# Patient Record
Sex: Female | Born: 1990 | Race: White | Hispanic: No | Marital: Single | Smoking: Never smoker
Health system: Southern US, Community
[De-identification: ages and names within clinical notes are randomized; demographics above are authoritative.]

## PROBLEM LIST (undated history)

## (undated) ENCOUNTER — Ambulatory Visit (HOSPITAL_COMMUNITY): Payer: PRIVATE HEALTH INSURANCE | Source: Home / Self Care

## (undated) DIAGNOSIS — D649 Anemia, unspecified: Secondary | ICD-10-CM

## (undated) DIAGNOSIS — E282 Polycystic ovarian syndrome: Secondary | ICD-10-CM

## (undated) DIAGNOSIS — F329 Major depressive disorder, single episode, unspecified: Secondary | ICD-10-CM

## (undated) DIAGNOSIS — E079 Disorder of thyroid, unspecified: Secondary | ICD-10-CM

## (undated) DIAGNOSIS — F909 Attention-deficit hyperactivity disorder, unspecified type: Secondary | ICD-10-CM

## (undated) DIAGNOSIS — F419 Anxiety disorder, unspecified: Secondary | ICD-10-CM

## (undated) DIAGNOSIS — A6920 Lyme disease, unspecified: Secondary | ICD-10-CM

## (undated) DIAGNOSIS — F32A Depression, unspecified: Secondary | ICD-10-CM

## (undated) DIAGNOSIS — D801 Nonfamilial hypogammaglobulinemia: Principal | ICD-10-CM

## (undated) HISTORY — DX: Depression, unspecified: F32.A

## (undated) HISTORY — DX: Lyme disease, unspecified: A69.20

## (undated) HISTORY — PX: WISDOM TOOTH EXTRACTION: SHX21

## (undated) HISTORY — DX: Anemia, unspecified: D64.9

## (undated) HISTORY — DX: Major depressive disorder, single episode, unspecified: F32.9

## (undated) HISTORY — DX: Nonfamilial hypogammaglobulinemia: D80.1

## (undated) HISTORY — DX: Polycystic ovarian syndrome: E28.2

## (undated) HISTORY — DX: Anxiety disorder, unspecified: F41.9

---
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
## 2012-08-15 ENCOUNTER — Ambulatory Visit
Admission: RE | Admit: 2012-08-15 | Discharge: 2012-08-15 | Disposition: A | Discharge status: Home / Self Care | Payer: BC Managed Care – PPO | Source: Ambulatory Visit | Attending: Allergy and Immunology | Admitting: Allergy and Immunology

## 2012-08-15 ENCOUNTER — Ambulatory Visit
Admission: RE | Admit: 2012-08-15 | Discharge: 2012-08-15 | Disposition: A | Discharge status: Home / Self Care | Payer: BC Managed Care – PPO | Source: Ambulatory Visit | Attending: Nurse Practitioner | Admitting: Nurse Practitioner

## 2012-08-15 ENCOUNTER — Other Ambulatory Visit: Payer: Self-pay | Admitting: Allergy and Immunology

## 2012-08-15 ENCOUNTER — Ambulatory Visit: Admission: RE | Admit: 2012-08-15 | Payer: BC Managed Care – PPO | Source: Ambulatory Visit

## 2012-08-15 DIAGNOSIS — J45990 Exercise induced bronchospasm: Secondary | ICD-10-CM

## 2012-08-15 DIAGNOSIS — N926 Irregular menstruation, unspecified: Secondary | ICD-10-CM

## 2013-11-08 IMAGING — US US PELVIS COMPLETE
1 series · 14 of 25 positions shown · non-contrast
Comparison: None.

CLINICAL DATA: 21-year-old with irregular vaginal bleeding.

TRANSABDOMINAL ULTRASOUND OF PELVIS
TECHNIQUE: Transabdominal ultrasound examination of the pelvis was
performed including evaluation of the uterus, ovaries, adnexal
regions, and pelvic cul-de-sac. No transvaginal study was
performed.

[Series 1: us pelvis complete · 0.27mm/px · 14 of 35 slices shown]
[im 1/35]
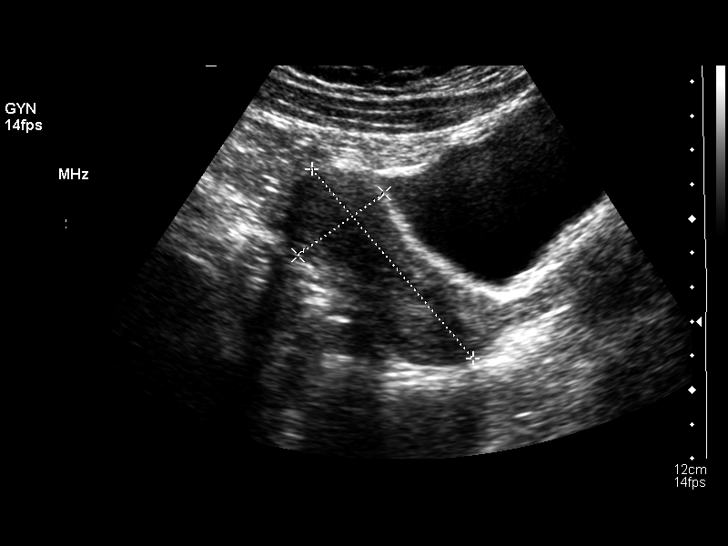
[im 3/35]
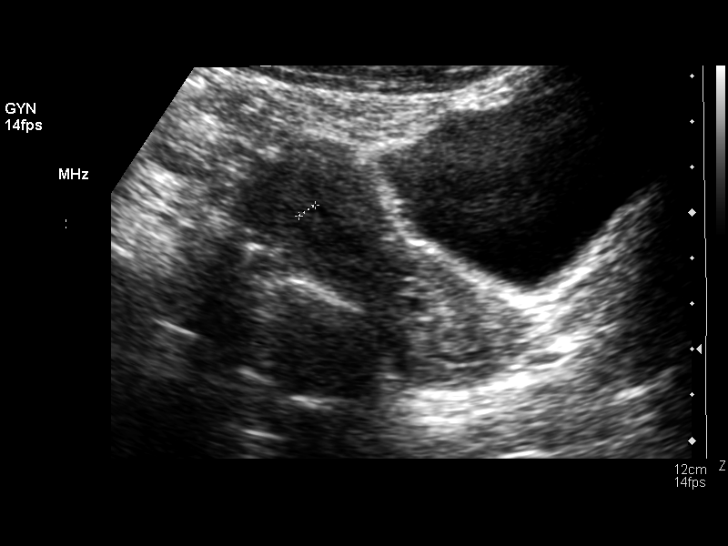
[im 6/35]
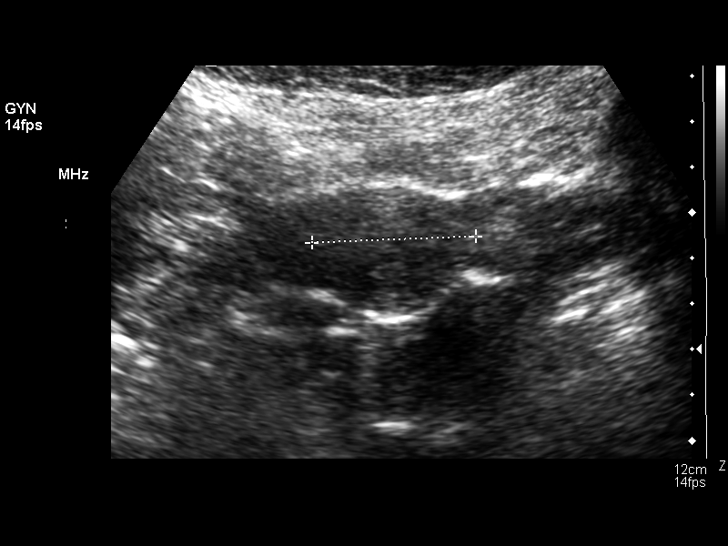
[im 9/35]
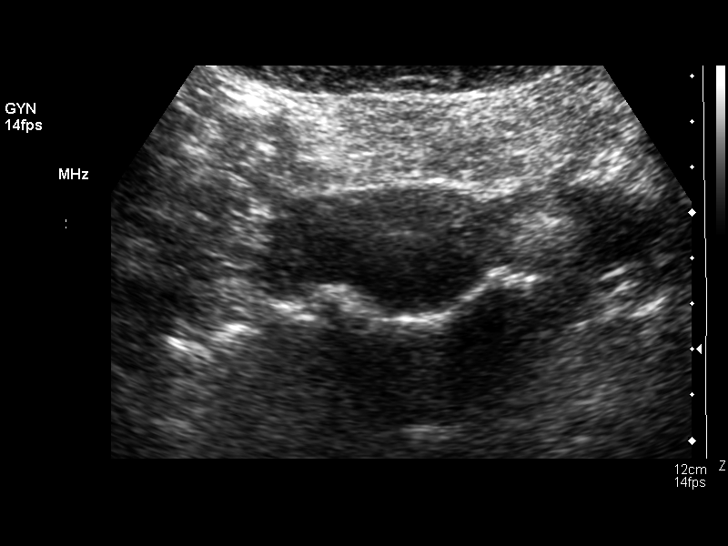
[im 12/35]
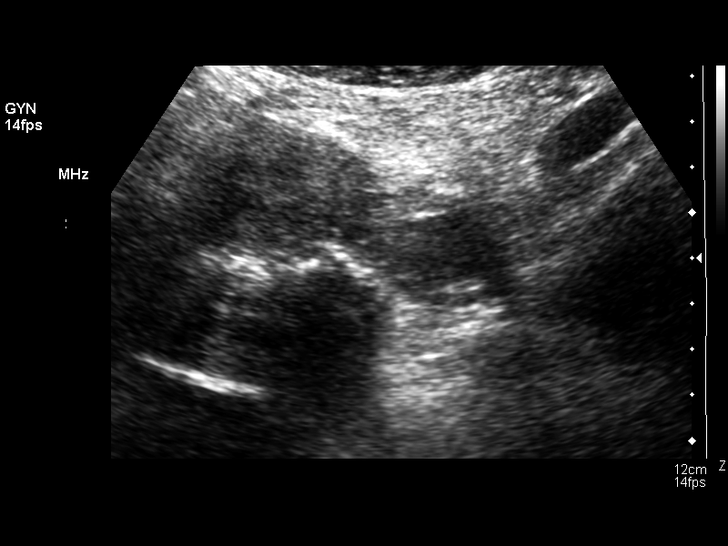
[im 13/35]
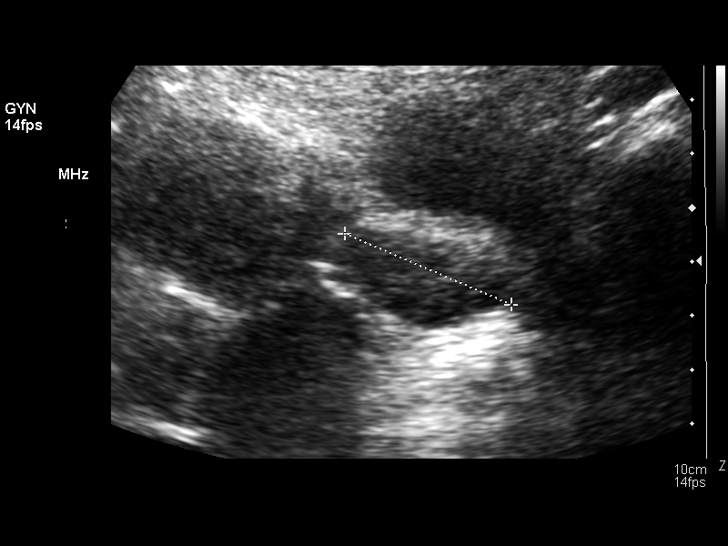
[im 16/35]
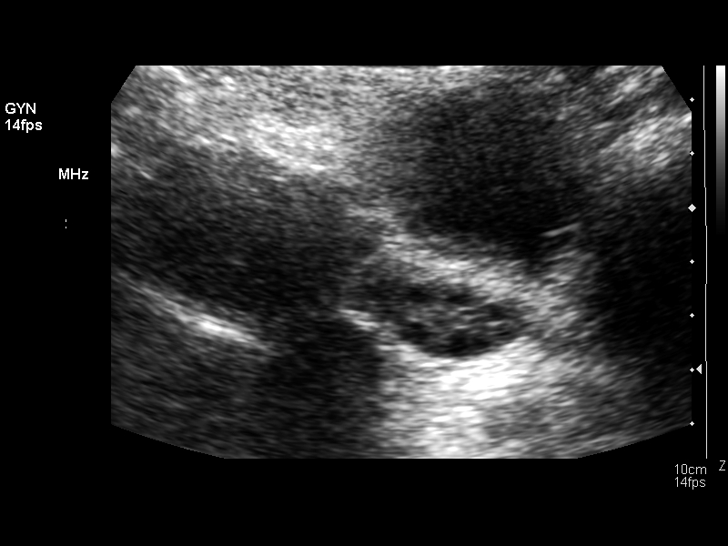
[im 19/35]
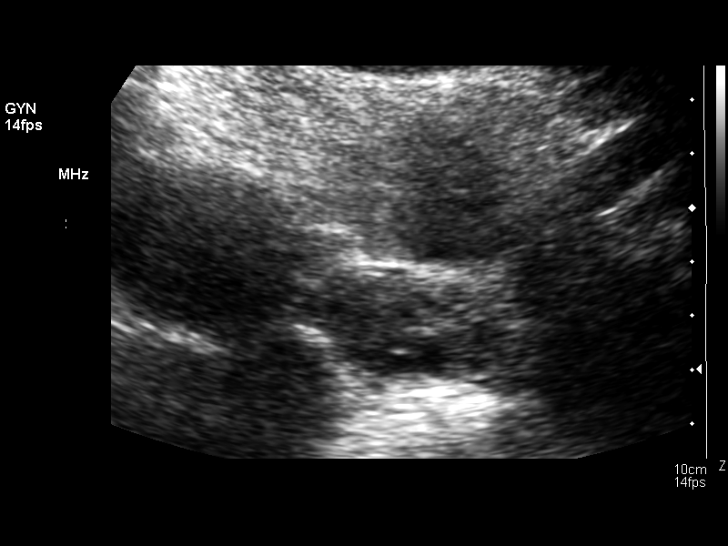
[im 22/35]
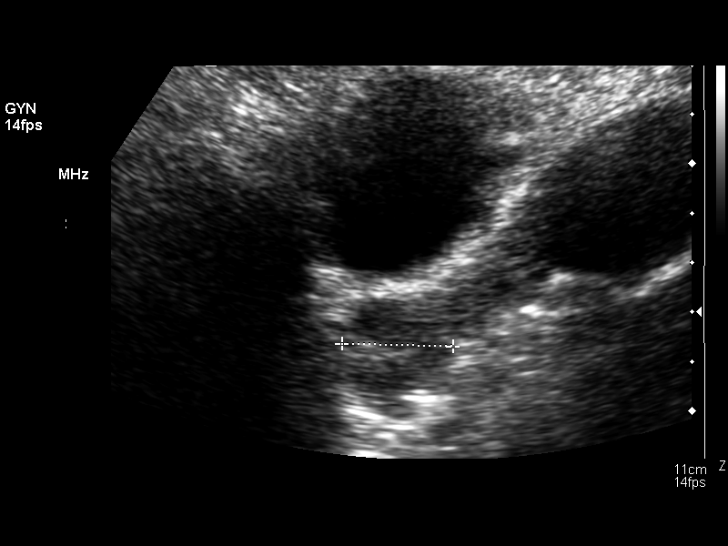
[im 23/35]
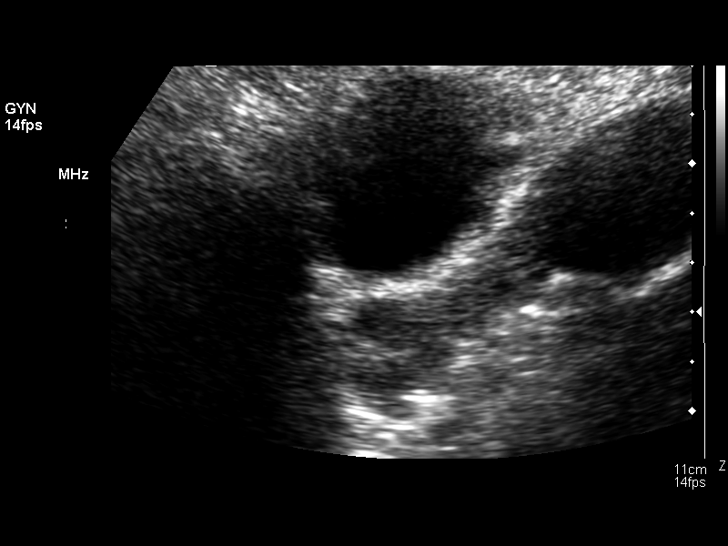
[im 26/35]
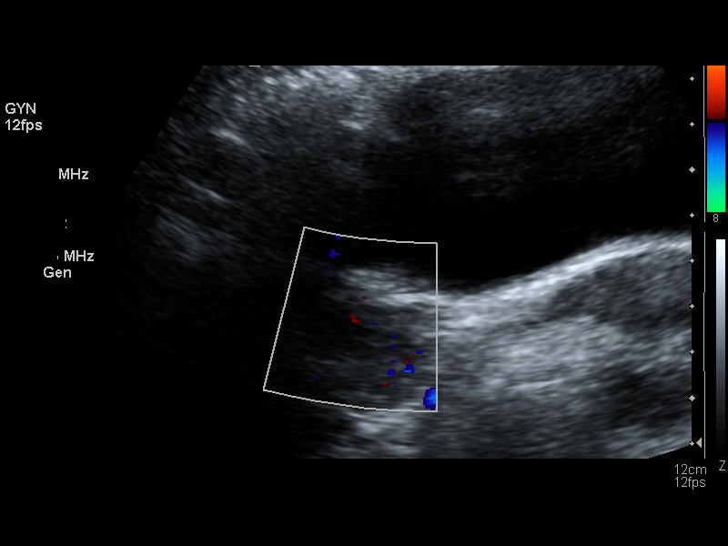
[im 29/35]
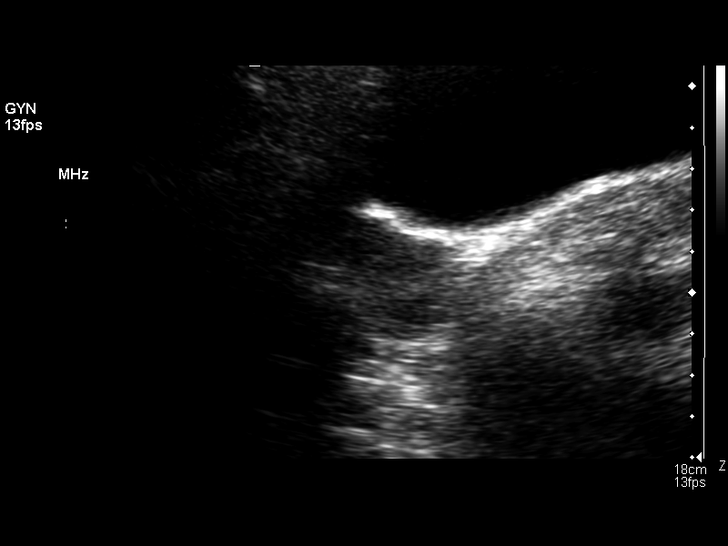
[im 32/35]
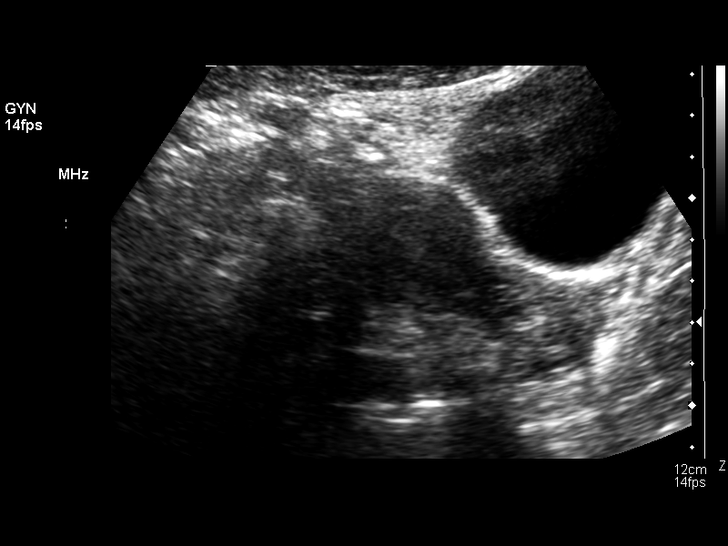
[im 35/35]
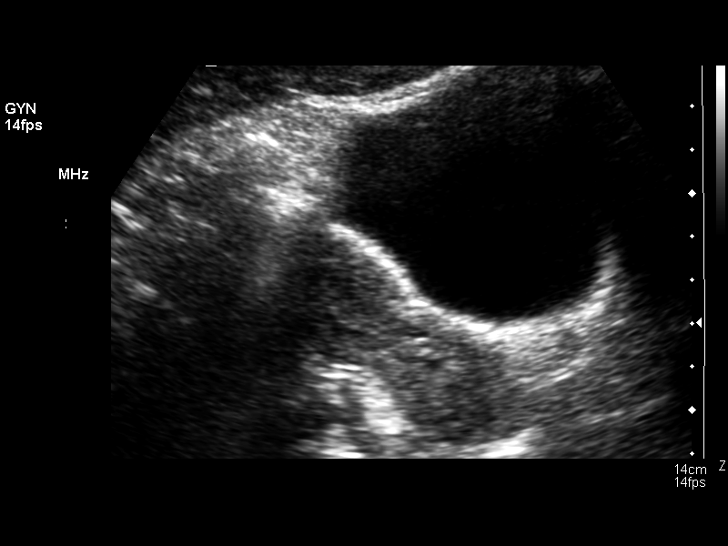

[14 of 25 positions shown; findings below may reference images not displayed]

FINDINGS: Uterus:  Normal in size and appearance, measuring 7.3 x 3.1 x
cm.  The myometrium appears homogeneous.

Endometrium: 4.3 mm in thickness.

Right ovary: Normal appearance, measuring 3.7 x 2.1 x 2.2 cm.  No
adnexal mass.

Left ovary: Normal appearance, measuring 3.4 x 2.3 x 3.0 cm.  No
adnexal mass.

Other Findings:  No free fluid
IMPRESSION: Normal transabdominal pelvic ultrasound.

## 2014-08-30 ENCOUNTER — Telehealth: Payer: Self-pay | Admitting: Hematology and Oncology

## 2014-08-30 NOTE — Telephone Encounter (Signed)
LEFT MESSAGE FOR PATIENT TO RETURN CALL TO SCHEDULE NP APPT.  °

## 2014-08-31 ENCOUNTER — Telehealth: Payer: Self-pay | Admitting: Hematology and Oncology

## 2014-08-31 NOTE — Telephone Encounter (Signed)
S/W PATIENT MOTHER AND GAVE NP APPT FOR 01/21 @ 10 W/DR. GORSUCH

## 2014-09-05 ENCOUNTER — Encounter: Payer: Self-pay | Admitting: Hematology and Oncology

## 2014-09-05 ENCOUNTER — Other Ambulatory Visit: Payer: Self-pay | Admitting: Hematology and Oncology

## 2014-09-05 DIAGNOSIS — D801 Nonfamilial hypogammaglobulinemia: Secondary | ICD-10-CM

## 2014-09-05 HISTORY — DX: Nonfamilial hypogammaglobulinemia: D80.1

## 2014-09-06 ENCOUNTER — Encounter: Payer: Self-pay | Admitting: Hematology and Oncology

## 2014-09-06 ENCOUNTER — Other Ambulatory Visit (HOSPITAL_BASED_OUTPATIENT_CLINIC_OR_DEPARTMENT_OTHER): Payer: BLUE CROSS/BLUE SHIELD

## 2014-09-06 ENCOUNTER — Ambulatory Visit (HOSPITAL_BASED_OUTPATIENT_CLINIC_OR_DEPARTMENT_OTHER): Payer: BLUE CROSS/BLUE SHIELD | Admitting: Hematology and Oncology

## 2014-09-06 ENCOUNTER — Encounter (INDEPENDENT_AMBULATORY_CARE_PROVIDER_SITE_OTHER): Payer: Self-pay

## 2014-09-06 ENCOUNTER — Ambulatory Visit: Payer: BLUE CROSS/BLUE SHIELD

## 2014-09-06 VITALS — BP 121/60 | HR 83 | Temp 98.5°F | Resp 18 | Ht 67.0 in | Wt 150.0 lb

## 2014-09-06 DIAGNOSIS — Z23 Encounter for immunization: Secondary | ICD-10-CM

## 2014-09-06 DIAGNOSIS — E611 Iron deficiency: Secondary | ICD-10-CM

## 2014-09-06 DIAGNOSIS — D801 Nonfamilial hypogammaglobulinemia: Secondary | ICD-10-CM

## 2014-09-06 DIAGNOSIS — Z862 Personal history of diseases of the blood and blood-forming organs and certain disorders involving the immune mechanism: Secondary | ICD-10-CM | POA: Insufficient documentation

## 2014-09-06 LAB — CBC WITH DIFFERENTIAL/PLATELET
BASO%: 0.5 % (ref 0.0–2.0)
BASOS ABS: 0 10*3/uL (ref 0.0–0.1)
EOS ABS: 0.1 10*3/uL (ref 0.0–0.5)
EOS%: 1.8 % (ref 0.0–7.0)
HCT: 39.9 % (ref 34.8–46.6)
HGB: 13 g/dL (ref 11.6–15.9)
LYMPH#: 2.5 10*3/uL (ref 0.9–3.3)
LYMPH%: 40.1 % (ref 14.0–49.7)
MCH: 30.2 pg (ref 25.1–34.0)
MCHC: 32.6 g/dL (ref 31.5–36.0)
MCV: 92.8 fL (ref 79.5–101.0)
MONO#: 0.4 10*3/uL (ref 0.1–0.9)
MONO%: 7 % (ref 0.0–14.0)
NEUT#: 3.1 10*3/uL (ref 1.5–6.5)
NEUT%: 50.6 % (ref 38.4–76.8)
Platelets: 305 10*3/uL (ref 145–400)
RBC: 4.3 10*6/uL (ref 3.70–5.45)
RDW: 11.9 % (ref 11.2–14.5)
WBC: 6.1 10*3/uL (ref 3.9–10.3)

## 2014-09-06 LAB — COMPREHENSIVE METABOLIC PANEL (CC13)
ALBUMIN: 4.6 g/dL (ref 3.5–5.0)
ALT: 13 U/L (ref 0–55)
AST: 18 U/L (ref 5–34)
Alkaline Phosphatase: 67 U/L (ref 40–150)
Anion Gap: 11 mEq/L (ref 3–11)
BUN: 12.1 mg/dL (ref 7.0–26.0)
CHLORIDE: 102 meq/L (ref 98–109)
CO2: 28 meq/L (ref 22–29)
Calcium: 9.7 mg/dL (ref 8.4–10.4)
Creatinine: 0.8 mg/dL (ref 0.6–1.1)
EGFR: >90 mL/min/{1.73_m2} (ref >90)
Glucose: 83 mg/dl (ref 70–140)
Potassium: 3.8 mEq/L (ref 3.5–5.1)
Sodium: 141 mEq/L (ref 136–145)
Total Bilirubin: 0.52 mg/dL (ref 0.20–1.20)
Total Protein: 7.7 g/dL (ref 6.4–8.3)

## 2014-09-06 LAB — LACTATE DEHYDROGENASE (CC13): LDH: 148 U/L (ref 125–245)

## 2014-09-06 MED ORDER — PNEUMOCOCCAL 13-VAL CONJ VACC IM SUSP
0.5000 mL | Freq: Once | INTRAMUSCULAR | Status: AC
  Administered 2014-09-06: 0.5 mL via INTRAMUSCULAR
  Filled 2014-09-06: qty 0.5

## 2014-09-06 NOTE — Assessment & Plan Note (Signed)
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 She was told to come here for possible immunoglobulin infusion. However, her last blood work in March 2015 did not suggest hypogammaglobulinemia. Recommend repeat check immunoglobulin levels and I discussed with her and her mother extensively that there will be a role in IVIG only if we have documented low immunoglobulin levels. Given her history of recurrent bacterial infection, I recommend infectious disease consultation. Her case is not typical for myeloperoxidase deficiency as the type of infections she had was more related to respiratory symptoms. I recommend Prevnar injection today. I will call her mother with test results.

## 2014-09-06 NOTE — Assessment & Plan Note (Signed)
She had history of iron deficiency anemia but currently she is not anemic. She does have mild iron deficiency. I recommend iron supplement 3 times a week for the next 6 months and recheck ferritin level with PCP.

## 2014-09-06 NOTE — Progress Notes (Signed)
[facility] CONSULT NOTE  Patient Care Team: Rita OharaJames Skeen, MD as PCP - General (Pain Medicine)  CHIEF COMPLAINTS/PURPOSE OF CONSULTATION:  History of recurrent infection, possible hypogammaglobulinemia  HISTORY OF PRESENTING ILLNESS:  Erika Merinomily Benway 24 y.o. female is here because of history of recurrent infection. Her mother is able to provide most of the history. I reviewed over 20 pages of outside records and collaborated the history with the patient. At birth, the patient requires severe infection as a newborn.  Within 2 months of birth, she developed streptococcal infection requiring antibiotics. At [age] year old, she had severe ear infection and tonsillitis. At grade school, she had recurrent influenza infection several times. As a teenager, she developed severe, recurrent upper respiratory and ear infections. 5 years ago, she was found to have Lyme disease that was documented with Weston blot method. In college, she was exposed to various infections and recalled being treated with 8 courses of Z-Pak. She was also found to have mycoplasma pneumonia and Chlamydia infection and received intravenous antibiotics. In 2015, she was treated with IV antibiotics and antifungal treatment. She was also evaluated by ENT in the past for recurrent upper respiratory tract infection. In addition, the patient had complained of chronic fatigue. She eventually history of heavy menorrhagia in the past and has severe iron deficiency anemia requiring IV iron. She never received blood transfusion. She was subsequently diagnosed with hypothyroidism and polycystic ovarian syndrome and is currently being treated for that.  MEDICAL HISTORY:  Past Medical History  Diagnosis Date  . PCOS (polycystic ovarian syndrome)   . Thyroid disease   . ADHD (attention deficit hyperactivity disorder)   . 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 No pertinent past surgical  history.  SOCIAL HISTORY: History   Social History  . Marital Status: Single    Spouse Name: N/A    Number of Children: N/A  . Years of Education: N/A   Occupational History  . Not on file.   Social History Main Topics  . Smoking status: Never Smoker   . Smokeless tobacco: Never Used  . Alcohol Use: No  . Drug Use: No  . Sexual Activity: No   Other Topics Concern  . Not on file   Social History Narrative    FAMILY HISTORY: History reviewed. No pertinent family history.  ALLERGIES:  has No Known Allergies.  MEDICATIONS:  Current Outpatient Prescriptions  Medication Sig Dispense Refill  . azithromycin (ZITHROMAX) 250 MG tablet Take by mouth daily.    . Cholecalciferol (VITAMIN D PO) Take 25,000 Units by mouth 3 (three) times a week.    Marland Kitchen. LIOTHYRONINE SODIUM PO Take by mouth.    . lisdexamfetamine (VYVANSE) 50 MG capsule Take 50 mg by mouth daily.    . metFORMIN (GLUMETZA) 1000 MG (MOD) 24 hr tablet Take 1,000 mg by mouth 2 (two) times daily with a meal.    . montelukast (SINGULAIR) 10 MG tablet Take 10 mg by mouth at bedtime.    Marland Kitchen. spironolactone (ALDACTONE) 100 MG tablet Take 200 mg by mouth daily. Pt takes 150mg  daily     No current facility-administered medications for this visit.    REVIEW OF SYSTEMS:   Constitutional: Denies fevers, chills or abnormal night sweats Eyes: Denies blurriness of vision, double vision or watery eyes Ears, nose, mouth, throat, and face: Denies mucositis or sore throat Respiratory: Denies cough, dyspnea or wheezes Cardiovascular: Denies palpitation, chest discomfort or lower extremity swelling Gastrointestinal:  Denies nausea,  heartburn or change in bowel habits Skin: Denies abnormal skin rashes Lymphatics: Denies new lymphadenopathy or easy bruising Neurological:Denies numbness, tingling or new weaknesses Behavioral/Psych: Mood is stable, no new changes  All other systems were reviewed with the patient and are negative.  PHYSICAL  EXAMINATION: ECOG PERFORMANCE STATUS: 0 - Asymptomatic  Filed Vitals:   09/06/14 1043  BP: 121/60  Pulse: 83  Temp: 98.5 F (36.9 C)  Resp: 18   Filed Weights   09/06/14 1043  Weight: 150 lb (68.04 kg)    GENERAL:alert, no distress and comfortable SKIN: skin color, texture, turgor are normal, no rashes or significant lesions EYES: normal, conjunctiva are pink and non-injected, sclera clear OROPHARYNX:no exudate, no erythema and lips, buccal mucosa, and tongue normal  NECK: supple, thyroid normal size, non-tender, without nodularity LYMPH:  no palpable lymphadenopathy in the cervical, axillary or inguinal LUNGS: clear to auscultation and percussion with normal breathing effort HEART: regular rate & rhythm and no murmurs and no lower extremity edema ABDOMEN:abdomen soft, non-tender and normal bowel sounds Musculoskeletal:no cyanosis of digits and no clubbing  PSYCH: alert & oriented x 3 with fluent speech NEURO: no focal motor/sensory deficits  LABORATORY DATA:  I have reviewed the data as listed Lab Results  Component Value Date   WBC 6.1 09/06/2014   HGB 13.0 09/06/2014   HCT 39.9 09/06/2014   MCV 92.8 09/06/2014   PLT 305 09/06/2014    Recent Labs  09/06/14 1229  NA 141  K 3.8  CO2 28  GLUCOSE 83  BUN 12.1  CREATININE 0.8  CALCIUM 9.7  PROT 7.7  ALBUMIN 4.6  AST 18  ALT 13  ALKPHOS 67  BILITOT 0.52  ASSESSMENT & PLAN:  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 She was told to come here for possible immunoglobulin infusion. However, her last blood work in March 2015 did not suggest hypogammaglobulinemia. Recommend repeat check immunoglobulin levels and I discussed with her and her mother extensively that there will be a role in IVIG only if we have documented low immunoglobulin levels. Given her history of recurrent bacterial infection, I recommend infectious disease consultation. Her case  is not typical for myeloperoxidase deficiency as the type of infections she had was more related to respiratory symptoms. I recommend Prevnar injection today. I will call her mother with test results.   History of iron deficiency anemia She had history of iron deficiency anemia but currently she is not anemic. She does have mild iron deficiency. I recommend iron supplement 3 times a week for the next 6 months and recheck ferritin level with PCP.      All questions were answered. The patient knows to call the clinic with any problems, questions or concerns. I spent 40 minutes counseling the patient face to face. The total time spent in the appointment was 60 minutes and more than 50% was on counseling.     [facility], Beverlyann Broxterman, MD 09/06/2014 3:34 PM

## 2014-09-06 NOTE — Progress Notes (Signed)
Checked in new pt with no financial concerns prior to seeing the dr.  Pt is here for a hematology concern so financial assistance may not be needed but she has Raquel's card for any billing or insurance questions or concerns. ° °

## 2014-09-07 ENCOUNTER — Telehealth: Payer: Self-pay | Admitting: Hematology and Oncology

## 2014-09-07 LAB — SEDIMENTATION RATE: Sed Rate: 4 mm/hr (ref 0–22)

## 2014-09-07 LAB — IGG, IGA, IGM
IGA: 138 mg/dL (ref 69–380)
IGG (IMMUNOGLOBIN G), SERUM: 976 mg/dL (ref 690–1700)
IGM, SERUM: 167 mg/dL (ref 52–322)

## 2014-09-07 NOTE — Telephone Encounter (Signed)
Pt was seen today, per 01/21 POF pt does not have to return but has a referral for Dr. Paulette Blanchornelius [place]. I called and office is closed will call back Monday when they reopen.

## 2014-09-10 ENCOUNTER — Telehealth: Payer: Self-pay | Admitting: Hematology and Oncology

## 2014-09-10 NOTE — Telephone Encounter (Signed)
I reviewed test results with the patient's mother. There is no indication for IVIG as discussed from previous visit.

## 2014-09-11 ENCOUNTER — Telehealth: Payer: Self-pay | Admitting: Hematology and Oncology

## 2014-09-11 NOTE — Telephone Encounter (Signed)
S/w Tammy at Infectious Disease advised they would pull from the workqueue and contact pt and our office advising of D/T for consultation.... KJ

## 2014-09-25 ENCOUNTER — Encounter: Payer: Self-pay | Admitting: Internal Medicine

## 2014-09-25 ENCOUNTER — Ambulatory Visit (INDEPENDENT_AMBULATORY_CARE_PROVIDER_SITE_OTHER): Payer: BLUE CROSS/BLUE SHIELD | Admitting: Internal Medicine

## 2014-09-25 VITALS — BP 114/78 | Temp 97.5°F | Ht 67.0 in | Wt 149.0 lb

## 2014-09-25 DIAGNOSIS — R5382 Chronic fatigue, unspecified: Secondary | ICD-10-CM | POA: Diagnosis not present

## 2014-09-25 NOTE — Progress Notes (Signed)
Patient ID: Erika Orr, female   DOB: 28-Aug-1990, 24 y.o.   MRN: [id number]         [facility]  Reason for Consult: History of recurrent infections Referring Physician: Dr. Artis DelayNi Gorsuch  Patient Active Problem List   Diagnosis Date Noted  . Chronic fatigue 09/25/2014    Priority: High  . History of iron deficiency anemia 09/06/2014  . Hypothyroidism 01/28/2011  . PCOS (polycystic ovarian syndrome) 01/28/2011    Patient's Medications  New Prescriptions   No medications on file  Previous Medications   AZITHROMYCIN (ZITHROMAX) 250 MG TABLET    Take by mouth daily.   CHOLECALCIFEROL (VITAMIN D PO)    Take 25,000 Units by mouth 3 (three) times a week.   LIOTHYRONINE SODIUM PO    Take by mouth.   LISDEXAMFETAMINE (VYVANSE) 50 MG CAPSULE    Take 50 mg by mouth daily.   METFORMIN (GLUMETZA) 1000 MG (MOD) 24 HR TABLET    Take 1,000 mg by mouth 2 (two) times daily with a meal.   MONTELUKAST (SINGULAIR) 10 MG TABLET    Take 10 mg by mouth at bedtime.   SPIRONOLACTONE (ALDACTONE) 100 MG TABLET    Take 200 mg by mouth daily. Pt takes 150mg  daily  Modified Medications   No medications on file  Discontinued Medications   No medications on file     Recommendations: 1. Discontinue daily azithromycin 2. I have offered to review more comprehensive past medical records. [name]'s mother will make photocopies and drop records by our office for my review. I will call them once I have reviewed the records.   Assessment: I have offered to review her extensive past medical records before making final recommendations. However based on the records I do have and her examination today I do not suspect any significant underlying immunodeficiency. I do not see any indication for chronic antibiotic therapy and would recommend stopping daily azithromycin. I also do not see any indication for starting immunoglobulin infusions. I think that she probably does meet current, new  definitions for chronic fatigue syndrome (systemic exertion intolerance disease).   HPI: Erika Orr is a 24 y.o. female who is referred to me for evaluation of her history of recurrent infections. She is accompanied by her mother today and she frequently defers to her mother to answer my questions. Her mother states that she was considered to be a very healthy newborn but developed a "cold" within several weeks of going home from the hospital. At 582 months of age she was diagnosed with strep throat requiring antibiotics and was told that that was very unusual. When she was a year and a half older she had problems with severe ear infections. She saw an ear nose and throat specialist who told her that her years looked fine but that she needed a tonsillectomy. I chose not to proceed with tonsillectomy and her tonsils were noted to be normal within the next year. Her mother recalls that she was diagnosed with influenza several times during middle and high school. She believes that she had H1 N1 influenza in 2010.  Her mother reiterates on several occasions during the exam that she was a "healthy child". However in recent years she has continued to be bothered with problems that he initially described as recurrent infections. She states that she has been treated with Z-Paks on numerous occasions and is currently taking azithromycin daily on a chronic basis. Her mother indicates that she was diagnosed with  having Lyme disease, Epstein-Barr disease, mycoplasma infection, Chlamydia pneumoniae infection as well as exposure to Aspergillus and black molds. She sees Elie Goody, NP locally at the [facility] and Dr. Hadassah Pais at [facility] in [place]. Her mother has a thick packet of previous lab results with her. He looks like a whole battery of infectious disease serologies have been done indicating high antibody levels to multiple infectious disease as noted  above.  I ask [name] to describe a typical bout of infection that would lead to her being prescribed antibiotics. She stated that this would generally occur when she feels more fatigued than usual. She her mother describe chronic fatigue that has interfered with her ability to do normal activities. She went away to college at [employer] but had to quit school and return her home for a period of time. She then enrolled at [employer] last year but again had to quit her studies and return home. She is now living at home and attending [employer] She states that she has not noted any improvement in her fatigue when she has been treated with antibiotics. She has had normal immunoglobulin levels measured on several occasions in the past several years. They tell me that Dr. Thresa Ross told her one year ago that she did not need supplemental immunoglobulin infusions. However they note that she was found to have a suboptimal response to pneumococcal vaccination last year and the recommendation was changed. She was told that she would need immunoglobulin infusions to prevent serious infection such as pneumonia.  Despite being fatigued and she states that she has not given up many of her usual activities other than school work. She does get some regular exercise. She sleeps well and wakes feeling rested most of the time. She does note that when she takes a full course load in college the effort easily overwhelms her and makes her fatigue greater. This is why she has had to leave school on 2 occasions. She denies ever having any periods of depression or anxiety.    Review of Systems: Constitutional: positive for fatigue, negative for anorexia, chills, fevers, sweats and weight loss Eyes: negative Ears, nose, mouth, throat, and face: negative Respiratory: negative Cardiovascular: negative Gastrointestinal: negative Genitourinary:negative Integument/breast: negative    Past Medical History   Diagnosis Date  . PCOS (polycystic ovarian syndrome)   . Thyroid disease   . ADHD (attention deficit hyperactivity disorder)   . 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 Anemia     History  Substance Use Topics  . Smoking status: Never Smoker   . Smokeless tobacco: Never Used  . Alcohol Use: 0.0 oz/week    0 Not specified per week     Comment: occasional    History reviewed. No pertinent family history. No Known Allergies  OBJECTIVE: Blood pressure 114/78, temperature 97.5 F (36.4 C), temperature source Oral, height  (1.702 m), weight 149 lb (67.586 kg). General: She is a healthy-appearing young woman in no distress Skin: No rash Lymph nodes: No palpable adenopathy Oral: No oropharyngeal lesions. Teeth are in good condition Lungs: Clear Cor: Regular S1 and S2 with no murmurs Abdomen: Soft and nontender with no palpable masses Mood and affect: Normal affect with no signs of depression or anxiety  Microbiology: No results found for this or any previous visit (from the past 240 hour(s)).  Cliffton Asters, MD [facility] [phone] pager   [phone] cell 09/25/2014, 1:35  PM

## 2014-12-06 DIAGNOSIS — J329 Chronic sinusitis, unspecified: Secondary | ICD-10-CM | POA: Insufficient documentation

## 2015-10-24 ENCOUNTER — Encounter: Payer: Self-pay | Admitting: Obstetrics and Gynecology

## 2015-11-01 ENCOUNTER — Telehealth: Payer: Self-pay | Admitting: Hematology and Oncology

## 2015-11-01 NOTE — Telephone Encounter (Signed)
[facility] office Erika Manis(Elizabeth) called to get fax number and confirm that Erika Orr is md and billing number

## 2016-01-06 ENCOUNTER — Encounter: Payer: Self-pay | Admitting: Obstetrics and Gynecology

## 2016-04-16 ENCOUNTER — Ambulatory Visit
Admission: RE | Admit: 2016-04-16 | Discharge: 2016-04-16 | Disposition: A | Discharge status: Home / Self Care | Payer: PRIVATE HEALTH INSURANCE | Source: Ambulatory Visit | Attending: Allergy and Immunology | Admitting: Allergy and Immunology

## 2016-04-16 ENCOUNTER — Other Ambulatory Visit: Payer: Self-pay | Admitting: Allergy and Immunology

## 2016-04-16 DIAGNOSIS — J45990 Exercise induced bronchospasm: Secondary | ICD-10-CM

## 2016-08-27 ENCOUNTER — Encounter: Payer: Self-pay | Admitting: Obstetrics & Gynecology

## 2016-12-17 ENCOUNTER — Encounter: Payer: Self-pay | Admitting: Obstetrics & Gynecology

## 2017-06-04 ENCOUNTER — Encounter: Payer: Self-pay | Admitting: Obstetrics & Gynecology

## 2017-09-02 ENCOUNTER — Encounter: Payer: BLUE CROSS/BLUE SHIELD | Admitting: Obstetrics & Gynecology

## 2017-09-21 ENCOUNTER — Encounter: Payer: Self-pay | Admitting: Obstetrics & Gynecology

## 2017-09-21 ENCOUNTER — Ambulatory Visit (INDEPENDENT_AMBULATORY_CARE_PROVIDER_SITE_OTHER): Payer: BLUE CROSS/BLUE SHIELD | Admitting: Obstetrics & Gynecology

## 2017-09-21 ENCOUNTER — Other Ambulatory Visit: Payer: Self-pay

## 2017-09-21 VITALS — BP 128/74 | HR 104 | Resp 14 | Ht 67.5 in | Wt 139.0 lb

## 2017-09-21 DIAGNOSIS — Z01419 Encounter for gynecological examination (general) (routine) without abnormal findings: Secondary | ICD-10-CM

## 2017-09-21 DIAGNOSIS — Z202 Contact with and (suspected) exposure to infections with a predominantly sexual mode of transmission: Secondary | ICD-10-CM | POA: Diagnosis not present

## 2017-09-21 DIAGNOSIS — Z124 Encounter for screening for malignant neoplasm of cervix: Secondary | ICD-10-CM | POA: Diagnosis not present

## 2017-09-21 NOTE — Progress Notes (Signed)
27 y.o. G0P0000 SingleCaucasianF here for new patient annual exam and to discuss cycles/contraception.  She is accompanied by her mother.  Pt still living at home and mother has been keeping track of cycles.  She has the last two years written down on a piece of paper that she brought today.    Mother reports cycles have been irregular for "years".  In the last year, her cycles were Jan 9-11, Feb 10-13, March 14-17, April 12-17, May 2-8 and 28-31,.  Skipped June.  Cycle was in July 1-4 and 24-26, Aug 2-4 and 21-23, Sep 21-23, Oct 13-17.  Has been on OCPs in the past.  Pt is concerned about weight gain but has been contemplating IUD use.    Has been diagnosed with PCOS.  Is on spironolactone and Metformin.  Was on [place]umetza but insurance won't cover.  Mother is convinced daughter "doesn't act the same".  Pt not clearly in agreement.  No specific side effects with Metformin XL.      Pt and mother have been followed [place] integr[facility] [place].  Has two inch thick folder of lab work.  No notes accompany this.  Most recent lab work copies to be scanned into Epic.  Pt admits to 12 prior sexual partners.  Mother is unaware of this.  Advised need for STD testing. Pt consents today only to GC/Chlamydia testing.  She declines other testing at this time.  Reports she is careful with condom use.  Patient's last menstrual period was 09/09/2017.          Sexually active: Yes.    The current method of family planning is condoms every time.    Exercising: Yes.    pilates, walking, weights Smoker:  no  Health Maintenance: Pap:  never History of abnormal Pap:  n/a MMG:  n/a Colonoscopy:  n/a BMD:   n/a TDaP:  09/08/17  Pneumonia vaccine(s):  09/06/14  Shingrix:   No  Hep C testing: no Screening Labs: [employer] [facility] , Hb today: same, Urine today: not collected    reports that  has never smoked. she has never used smokeless tobacco. She reports that she drinks alcohol. She  reports that she does not use drugs.  Past Medical History:  Diagnosis Date  . ADHD (attention deficit hyperactivity disorder)   . Anemia   . Anxiety   . Depression   . 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 Lyme disease   . PCOS (polycystic ovarian syndrome)   . Polycystic ovarian syndrome   . Thyroid disease     Past Surgical History:  Procedure Laterality Date  . WISDOM TOOTH EXTRACTION      Current Outpatient Medications  Medication Sig Dispense Refill  . amoxicillin (AMOXIL) 500 MG capsule TAKE 1 CAPSULE (500 MG TOTAL) BY MOUTH 3 TIMES DAILY  1  . IRON PO Take by mouth.    . Lactobacillus Rhamnosus, GG, (CULTURELLE PO) Take by mouth.    Marland Kitchen. LIOTHYRONINE SODIUM PO Take by mouth.    . lisdexamfetamine (VYVANSE) 50 MG capsule Take 50 mg by mouth daily.    . metFORMIN (GLUMETZA) 1000 MG (MOD) 24 hr tablet Take 1,000 mg by mouth 2 (two) times daily with a meal.    . montelukast (SINGULAIR) 10 MG tablet Take 10 mg by mouth at bedtime.    . NON FORMULARY Med Name: **  MMB 5/2.5/50 mg 3 x weekly    . NON FORMULARY Dr. Janalyn Rouseawls    .  spironolactone (ALDACTONE) 100 MG tablet Take 200 mg by mouth daily. Pt takes 150mg  daily    . venlafaxine XR (EFFEXOR-XR) 75 MG 24 hr capsule Take by mouth daily.  2  . Vitamin D, Ergocalciferol, (DRISDOL) 50000 units CAPS capsule Take by mouth.    Marland Kitchen azithromycin (ZITHROMAX) 250 MG tablet Take by mouth daily.     No current facility-administered medications for this visit.     Family History  Problem Relation Age of Onset  . Heart disease Paternal Grandmother   . Heart disease Paternal Grandfather     ROS:  Pertinent items are noted in HPI.  Otherwise, a comprehensive ROS was negative.  Exam:   BP 128/74 (BP Location: Right Arm, Patient Position: Sitting, Cuff Size: Normal)   Pulse (!) 104   Resp 14   Ht 5' 7.5" (1.715 m)   Wt 139 lb (63 kg)   LMP 09/09/2017   BMI 21.45 kg/m    Height: 5' 7.5" (171.5 cm)  Ht Readings from Last 3  Encounters:  09/21/17 5' 7.5" (1.715 m)  09/25/14 5\' 7"  (1.702 m)  09/06/14 5\' 7"  (1.702 m)    General appearance: alert, cooperative and appears stated age Head: Normocephalic, without obvious abnormality, atraumatic Neck: no adenopathy, supple, symmetrical, trachea midline and thyroid normal to inspection and palpation Lungs: clear to auscultation bilaterally Breasts: normal appearance, no masses or tenderness Heart: regular rate and rhythm Abdomen: soft, non-tender; bowel sounds normal; no masses,  no organomegaly Extremities: extremities normal, atraumatic, no cyanosis or edema Skin: Skin color, texture, turgor normal. No rashes or lesions Lymph nodes: Cervical, supraclavicular, and axillary nodes normal. No abnormal inguinal nodes palpated Neurologic: Grossly normal   Pelvic: External genitalia:  no lesions              Urethra:  normal appearing urethra with no masses, tenderness or lesions              Bartholins and Skenes: normal                 Vagina: normal appearing vagina with normal color and discharge, no lesions              Cervix: no lesions              Pap taken: Yes.   Bimanual Exam:  Uterus:  normal size, contour, position, consistency, mobility, non-tender              Adnexa: normal adnexa and no mass, fullness, tenderness               Rectovaginal: Confirms               Anus:  normal sphincter tone, no lesions  Chaperone was present for exam.  A:  Well Woman with normal exam PCOS SA with multiple prior partners, no recent STD testing H/O iron deficiency anemia in the past requiring iron transfusions.  Most recent lab work is normal. Hypothyroidism   P:   Mammogram guidelines reviewed Contraception options discussed.  Most interested in IUD.  Information provided.  Will precert.  Pt knows to call with onset of cycle if desires placement.  Could also use POP but she is favoring IUD. pap smear obtained today.  GC/CHL only obtained pt pt's desires.   Feel full STD testing is warranted. Return annually or prn

## 2017-09-24 ENCOUNTER — Other Ambulatory Visit (HOSPITAL_COMMUNITY)
Admission: RE | Admit: 2017-09-24 | Discharge: 2017-09-24 | Disposition: A | Discharge status: Home / Self Care | Payer: BLUE CROSS/BLUE SHIELD | Source: Ambulatory Visit | Attending: Obstetrics & Gynecology | Admitting: Obstetrics & Gynecology

## 2017-09-24 DIAGNOSIS — Z124 Encounter for screening for malignant neoplasm of cervix: Secondary | ICD-10-CM | POA: Diagnosis present

## 2017-09-24 DIAGNOSIS — Z202 Contact with and (suspected) exposure to infections with a predominantly sexual mode of transmission: Secondary | ICD-10-CM | POA: Diagnosis present

## 2017-09-30 LAB — CYTOLOGY - PAP
Chlamydia: NEGATIVE
Diagnosis: UNDETERMINED — AB
HPV (WINDOPATH): DETECTED — AB
Neisseria Gonorrhea: NEGATIVE

## 2017-10-01 ENCOUNTER — Telehealth: Payer: Self-pay | Admitting: *Deleted

## 2017-10-01 NOTE — Telephone Encounter (Signed)
Called cell phone number. Voicemail not set up. Unable to leave message.  Please route to triage if I am not available.   Jerene BearsMiller, Mary S, MD  Gara KronerMorales, Reina C, CMA        Please let pt know her pap showed ASCUS changes with +HR HPV. Needs pap 1 year. 08 recall.   Also, GC/CHL testing was negative.

## 2017-10-05 NOTE — Telephone Encounter (Signed)
Second attempt to contact patient. Unable to leave message. Voicemail not set up.

## 2017-10-07 ENCOUNTER — Encounter: Payer: Self-pay | Admitting: Obstetrics & Gynecology

## 2017-10-07 NOTE — Telephone Encounter (Signed)
Call to patient cell [phone], listed on DPR. Unable to leave message, voice mail not set up.

## 2017-10-08 NOTE — Telephone Encounter (Signed)
Unable to reach letter sent to patient. Per Dr. Hyacinth MeekerMiller

## 2017-10-22 ENCOUNTER — Telehealth: Payer: Self-pay | Admitting: Obstetrics & Gynecology

## 2017-10-22 NOTE — Telephone Encounter (Signed)
Attempted to reach patient at number provided. There was no answer and recording states that the voicemail box has not been set up yet.  Notes recorded by Jerene BearsMiller, Mary S, MD on 10/01/2017 at 2:35 PM EST Please let pt know her pap showed ASCUS changes with +HR HPV. Needs pap 1 year. 08 recall.  Also, GC/CHL testing was negative.

## 2017-10-22 NOTE — Telephone Encounter (Signed)
Patient received a letter to call for lab results. [phone]

## 2017-10-26 NOTE — Telephone Encounter (Signed)
Spoke with patient. Results given. Patient verbalizes understanding. 08 recall placed. Encounter closed.  

## 2017-10-26 NOTE — Telephone Encounter (Signed)
Return call to Kaitlyn. °

## 2018-08-08 ENCOUNTER — Ambulatory Visit (INDEPENDENT_AMBULATORY_CARE_PROVIDER_SITE_OTHER): Payer: BLUE CROSS/BLUE SHIELD | Admitting: Obstetrics & Gynecology

## 2018-08-08 ENCOUNTER — Telehealth: Payer: Self-pay | Admitting: Obstetrics & Gynecology

## 2018-08-08 VITALS — BP 118/64 | HR 106 | Resp 14 | Ht 67.5 in | Wt 150.0 lb

## 2018-08-08 DIAGNOSIS — Z3009 Encounter for other general counseling and advice on contraception: Secondary | ICD-10-CM | POA: Diagnosis not present

## 2018-08-08 DIAGNOSIS — N750 Cyst of Bartholin's gland: Secondary | ICD-10-CM | POA: Diagnosis not present

## 2018-08-08 DIAGNOSIS — B977 Papillomavirus as the cause of diseases classified elsewhere: Secondary | ICD-10-CM

## 2018-08-08 NOTE — Progress Notes (Signed)
GYNECOLOGY  VISIT  CC:  Vulvar swelling   HPI: 27 y.o. G0P0000 Single White or Caucasian female here for vulvar swelling patient noticed with bathing.  Does have a known Bartholin's cyst that has been present.  Pt kind of forgot this was noted with prior exam.  Denies pain or drainage.  Has no vaginal discharge.  Pt is SA.  She has been tested for GC/Chl in the past year.    Most recent pap showed +HR HPV.  Mother accompanies her today and has many questions about this including the Gardisil vaccination and usefulness once HPV is present.  Questions answered.  Feel Gardisil is still beneficial and encouraged to consider.  New extended use of this by FDA discussed.  Series reviewed.  Questions regarding transmission, causes, follow up, clearance rates, possible results of having HR HPV all discussed.  Lastly, contraception options want to be discussed today.  Pt's mother would like her to consider IUD.  Types, placement, success rates, side effects, risks reviewed.  All questions answered.    GYNECOLOGIC HISTORY: No LMP recorded. Contraception: none Menopausal hormone therapy:none  Patient Active Problem List   Diagnosis Date Noted  . Chronic recurrent sinusitis 12/06/2014  . Chronic fatigue 09/25/2014  . History of iron deficiency anemia 09/06/2014  . Acquired hypothyroidism 01/28/2011  . PCOS (polycystic ovarian syndrome) 01/28/2011    Past Medical History:  Diagnosis Date  . ADHD (attention deficit hyperactivity disorder)   . Anemia   . Anxiety   . Depression   . 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 Lyme disease   . PCOS (polycystic ovarian syndrome)   . Polycystic ovarian syndrome   . Thyroid disease     Past Surgical History:  Procedure Laterality Date  . WISDOM TOOTH EXTRACTION      MEDS:   Current Outpatient Medications on File Prior to Visit  Medication Sig Dispense Refill  . IRON PO Take by mouth.    . Lactobacillus Rhamnosus, GG, (CULTURELLE PO) Take by  mouth.    Marland Kitchen. LIOTHYRONINE SODIUM PO Take by mouth.    . NON FORMULARY Med Name: **  MMB 5/2.5/50 mg 3 x weekly    . venlafaxine XR (EFFEXOR-XR) 75 MG 24 hr capsule Take by mouth daily.  2  . Vitamin D, Ergocalciferol, (DRISDOL) 50000 units CAPS capsule Take by mouth.    Marland Kitchen. azithromycin (ZITHROMAX) 250 MG tablet Take by mouth daily.    Marland Kitchen. lisdexamfetamine (VYVANSE) 50 MG capsule Take 50 mg by mouth daily.    . metFORMIN (GLUMETZA) 1000 MG (MOD) 24 hr tablet Take 1,000 mg by mouth 2 (two) times daily with a meal.    . montelukast (SINGULAIR) 10 MG tablet Take 10 mg by mouth at bedtime.    . NON FORMULARY Dr. Janalyn Rouseawls     No current facility-administered medications on file prior to visit.     ALLERGIES: Patient has no known allergies.  Family History  Problem Relation Age of Onset  . Heart disease Paternal Grandmother   . Heart disease Paternal Grandfather     SH:  Single, non smoker  Review of Systems  Genitourinary:       Vulvar swelling    PHYSICAL EXAMINATION:    BP 118/64   Pulse (!) 106   Resp 14   Ht 5' 7.5" (1.715 m)   Wt 150 lb (68 kg)   BMI 23.15 kg/m     General appearance: alert, cooperative and appears stated age Lymph:  no inguinal LAD  noted  Pelvic: External genitalia:  Left 3.0cm bartholin's cyst gland enlargement noted              Urethra:  normal appearing urethra with no masses, tenderness or lesions              Bartholins and Skenes: normal                 Vagina: normal appearing vagina with normal color and discharge, no lesions              Chaperone was present for exam.  Assessment: Left 3.0cm Bartholin's cyst gland noted today.  Indications for treatment reviewed.  At this time, I feel should be followed. +HR HPV with last pap Considering IUD placement for contraception  Plan: Follow up pap and HR HPV testing due 09/2018.  This has not been scheduled.  Recall is present. Will precert IUD on Thursday morning and contact pt.  If desires, can  have placement that day.   ~30 minutes spent with patient >50% of time was in face to face discussion of above.

## 2018-08-08 NOTE — Telephone Encounter (Signed)
Patient called with concerns she may have a Bartholin cyst. She's like to speak with the nurse for advice and to make a plan.   Last seen: 09/21/17

## 2018-08-08 NOTE — Telephone Encounter (Signed)
Spoke with patient.  She c/o two days of Left side vaginal swelling.  She thinks it's a bartholins cyst. Has not had one before. This swelling developed after leaving a tampon in over night. States swelling is about the size of a gumball. Feels hard. No pain. No drainage.  She has been following instructions that she read on the Internet and is using sitz baths with epsom salts.  Advised patient to not manipulate area.  Can continue with sitz baths.  Office visit at 1545 today with Dr. Hyacinth MeekerMiller.  Encounter to Dr. Sunday CornMiler and will close.

## 2018-08-11 ENCOUNTER — Telehealth: Payer: Self-pay | Admitting: Obstetrics & Gynecology

## 2018-08-11 ENCOUNTER — Encounter: Payer: Self-pay | Admitting: Obstetrics & Gynecology

## 2018-08-11 NOTE — Telephone Encounter (Signed)
See previous message. Second call placed to patient to review benefits and schedule a Kyleena IUD. Left second voicemail message requesting a return call   cc: Dr Hyacinth MeekerMiller

## 2018-08-11 NOTE — Telephone Encounter (Signed)
Call placed to review benefits and [facility] Hyacinth MeekerMiller. Left voicemail message requesting a return call   cc: Dr Hyacinth MeekerMiller

## 2018-08-12 ENCOUNTER — Telehealth: Payer: Self-pay | Admitting: Obstetrics & Gynecology

## 2018-08-12 NOTE — Telephone Encounter (Signed)
Call placed to schedule appointment for IUD.

## 2019-03-03 ENCOUNTER — Telehealth: Payer: Self-pay | Admitting: *Deleted

## 2019-03-03 NOTE — Telephone Encounter (Signed)
-----   Message from Huey Romans, RN sent at 02/27/2019  3:58 PM EDT ----- Regarding: 08 annual Overdue for 08 annual . Lasst 09-2017 Ascus  pos HPV

## 2019-03-03 NOTE — Telephone Encounter (Signed)
Left voicemail to call back to schedule AEX. - 08 recall

## 2019-03-07 NOTE — Telephone Encounter (Signed)
Left voicemail to call back to schedule AEX.  

## 2019-03-20 ENCOUNTER — Encounter: Payer: Self-pay | Admitting: *Deleted

## 2019-03-20 NOTE — Telephone Encounter (Signed)
08 recall letter printed and will mail to patient.

## 2019-05-12 ENCOUNTER — Other Ambulatory Visit: Payer: Self-pay

## 2019-05-16 ENCOUNTER — Encounter: Payer: Self-pay | Admitting: Obstetrics & Gynecology

## 2019-05-16 ENCOUNTER — Other Ambulatory Visit: Payer: Self-pay

## 2019-05-16 ENCOUNTER — Ambulatory Visit (INDEPENDENT_AMBULATORY_CARE_PROVIDER_SITE_OTHER): Payer: BC Managed Care – PPO | Admitting: Obstetrics & Gynecology

## 2019-05-16 ENCOUNTER — Other Ambulatory Visit (HOSPITAL_COMMUNITY)
Admission: RE | Admit: 2019-05-16 | Discharge: 2019-05-16 | Disposition: A | Discharge status: Home / Self Care | Payer: BC Managed Care – PPO | Source: Ambulatory Visit | Attending: Obstetrics & Gynecology | Admitting: Obstetrics & Gynecology

## 2019-05-16 VITALS — BP 100/70 | HR 84 | Temp 97.3°F | Ht 68.0 in | Wt 145.6 lb

## 2019-05-16 DIAGNOSIS — R87810 Cervical high risk human papillomavirus (HPV) DNA test positive: Secondary | ICD-10-CM | POA: Insufficient documentation

## 2019-05-16 DIAGNOSIS — Z01419 Encounter for gynecological examination (general) (routine) without abnormal findings: Secondary | ICD-10-CM

## 2019-05-16 DIAGNOSIS — R87610 Atypical squamous cells of undetermined significance on cytologic smear of cervix (ASC-US): Secondary | ICD-10-CM | POA: Diagnosis not present

## 2019-05-16 DIAGNOSIS — Z113 Encounter for screening for infections with a predominantly sexual mode of transmission: Secondary | ICD-10-CM | POA: Diagnosis not present

## 2019-05-16 DIAGNOSIS — Z124 Encounter for screening for malignant neoplasm of cervix: Secondary | ICD-10-CM

## 2019-05-16 MED ORDER — SPIRONOLACTONE 100 MG PO TABS: 100.0000 mg | ORAL_TABLET | Freq: Every evening | ORAL | 0 refills | Status: AC | PRN

## 2019-05-16 NOTE — Progress Notes (Signed)
28 y.o. G0P0000 Single White or Caucasian female here for annual exam.  Doing well.  She got a job with a [employer], political [employer] in [place].  She is working remotely right now.  She is in a temp job right now.  She does not have to move right now.    Cycles are still irregular.  Cycles last about 4-5 days.  Blood is more dark looking.  She does not have two cycles in a month which she used to have.  Not using progesterone cream right now.    Had blood work done in August.  Sees Theodora Blow for integrative medicine care.    SA with same partner for a year and a half.    Patient's last menstrual period was 05/09/2019 (exact date).          Sexually active: Yes.    The current method of family planning is condoms most of the time.    Exercising: Yes.    pilates, cardio Smoker:  no  Health Maintenance: Pap:  09/24/17 ASCUS. HR HPV:+detected  History of abnormal Pap:  yes Gardasil: No TDaP:  2019 Screening Labs: PCP   reports that she has never smoked. She has never used smokeless tobacco. She reports current alcohol use. She reports that she does not use drugs.  Past Medical History:  Diagnosis Date  . ADHD (attention deficit hyperactivity disorder)   . Anemia   . Anxiety   . Depression   . 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 Lyme disease   . PCOS (polycystic ovarian syndrome)   . Polycystic ovarian syndrome   . Thyroid disease     Past Surgical History:  Procedure Laterality Date  . WISDOM TOOTH EXTRACTION      Current Outpatient Medications  Medication Sig Dispense Refill  . lisdexamfetamine (VYVANSE) 50 MG capsule Take 50 mg by mouth daily.    . montelukast (SINGULAIR) 10 MG tablet Take 10 mg by mouth at bedtime.    . NON FORMULARY Med Name: **  MMB 5/2.5/50 mg 3 x weekly    . PROAIR HFA 108 (90 Base) MCG/ACT inhaler INL 2 PFS PO Q 6 H PRN    . spironolactone (ALDACTONE) 100 MG tablet Take 1 tablet (100 mg total) by mouth at bedtime as needed. 30 tablet 0  . TRULICITY 1.5  MG/0.5ML SOPN INJ 1.5 MG INTO THE SKIN Q WEEK    . venlafaxine XR (EFFEXOR-XR) 75 MG 24 hr capsule Take by mouth daily.  2   No current facility-administered medications for this visit.     Family History  Problem Relation Age of Onset  . Heart disease Paternal Grandmother   . Heart disease Paternal Grandfather     Review of Systems  All other systems reviewed and are negative.   Exam:   BP 100/70   Pulse 84   Temp (!) 97.3 F (36.3 C) (Temporal)   Ht 5\' 8"  (1.727 m)   Wt 145 lb 9.6 oz (66 kg)   LMP 05/09/2019 (Exact Date)   BMI 22.14 kg/m   Height: 5\' 8"  (172.7 cm)  Ht Readings from Last 3 Encounters:  05/16/19 5\' 8"  (1.727 m)  08/08/18 5' 7.5" (1.715 m)  09/21/17 5' 7.5" (1.715 m)    General appearance: alert, cooperative and appears stated age Head: Normocephalic, without obvious abnormality, atraumatic Neck: no adenopathy, supple, symmetrical, trachea midline and thyroid normal to inspection and palpation Lungs: clear to auscultation bilaterally Breasts: normal appearance, no masses or tenderness Heart:  regular rate and rhythm Abdomen: soft, non-tender; bowel sounds normal; no masses,  no organomegaly Extremities: extremities normal, atraumatic, no cyanosis or edema Skin: Skin color, texture, turgor normal. No rashes or lesions Lymph nodes: Cervical, supraclavicular, and axillary nodes normal. No abnormal inguinal nodes palpated Neurologic: Grossly normal   Pelvic: External genitalia:  no lesions              Urethra:  normal appearing urethra with no masses, tenderness or lesions              Bartholins and Skenes: normal                 Vagina: normal appearing vagina with normal color and discharge, no lesions              Cervix: no lesions              Pap taken: Yes.   Bimanual Exam:  Uterus:  normal size, contour, position, consistency, mobility, non-tender              Adnexa: normal adnexa and no mass, fullness, tenderness               Rectovaginal:  Confirms               Anus:  normal sphincter tone, no lesions  Chaperone was present for exam.  A:  Well Woman with normal exam PCOS SA H/o iron deficiency anemia Hypothyroidism  P:   Mammogram guidelines reviewed pap smear with HR HPV obtained Labs done in August Gardisil vaccination discussed Discussed PUS for additional evaluation.  She will consider this.   Return annually or prn

## 2019-05-17 LAB — HIV ANTIBODY (ROUTINE TESTING W REFLEX): HIV Screen 4th Generation wRfx: NONREACTIVE

## 2019-05-17 LAB — HEPATITIS C ANTIBODY: Hep C Virus Ab: <0.1 s/co ratio (ref 0.0–0.9)

## 2019-05-17 LAB — RPR: RPR Ser Ql: NONREACTIVE

## 2019-05-19 LAB — CYTOLOGY - PAP
Chlamydia: NEGATIVE
Diagnosis: NEGATIVE
Diagnosis: REACTIVE
High risk HPV: POSITIVE — AB
Neisseria Gonorrhea: NEGATIVE

## 2019-05-23 ENCOUNTER — Telehealth: Payer: Self-pay | Admitting: *Deleted

## 2019-05-23 NOTE — Telephone Encounter (Signed)
-----   Message from Megan Salon, MD sent at 05/23/2019 12:58 PM EDT ----- Please let pt know her pap was normal.  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  GC/CHl negative.  Repeat pap 1 year.  Needs 12 month recall.  Please make sure appt is scheduled.  Thanks.

## 2019-05-23 NOTE — Telephone Encounter (Signed)
[email or web address]

## 2019-05-25 NOTE — Telephone Encounter (Signed)
Left voicemail to call back. Second attempt  12 recall in epic

## 2019-05-30 NOTE — Telephone Encounter (Signed)
Notes recorded by Polly Cobia, CMA on 05/30/2019 at 10:32 AM EDT  Patient didn't call back. Attempted to call patient again. Third attempt.  Left voicemail for pt with results and new appt. Ok Per pt release form.  Appt rescheduled for 06/03/20 @3 :00pm

## 2020-05-31 NOTE — Progress Notes (Deleted)
29 y.o. G0P0000 Single White or Caucasian female here for annual exam.    No LMP recorded.          Sexually active: {yes no:314532}  The current method of family planning is {contraception:315051}.    Exercising: {yes no:314532}  {types:19826} Smoker:  {YES NO:22349}  Health Maintenance: Pap:  09-24-17 ASCUS HPV HR+, 05-16-2019 neg HPV HR+ History of abnormal Pap:  yes MMG:  none Colonoscopy:  none BMD:   none TDaP:  2019 Pneumonia vaccine(s):  2016 Shingrix:   no Hep C testing: neg 2020 Screening Labs: ***   reports that she has never smoked. She has never used smokeless tobacco. She reports current alcohol use. She reports that she does not use drugs.  Past Medical History:  Diagnosis Date  . ADHD (attention deficit hyperactivity disorder)   . Anemia   . Anxiety   . Depression   . 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 Lyme disease   . PCOS (polycystic ovarian syndrome)   . Polycystic ovarian syndrome   . Thyroid disease     Past Surgical History:  Procedure Laterality Date  . WISDOM TOOTH EXTRACTION      Current Outpatient Medications  Medication Sig Dispense Refill  . lisdexamfetamine (VYVANSE) 50 MG capsule Take 50 mg by mouth daily.    . montelukast (SINGULAIR) 10 MG tablet Take 10 mg by mouth at bedtime.    . NON FORMULARY Med Name: **  MMB 5/2.5/50 mg 3 x weekly    . PROAIR HFA 108 (90 Base) MCG/ACT inhaler INL 2 PFS PO Q 6 H PRN    . spironolactone (ALDACTONE) 100 MG tablet Take 1 tablet (100 mg total) by mouth at bedtime as needed. 30 tablet 0  . TRULICITY 1.5 MG/0.5ML SOPN INJ 1.5 MG INTO THE SKIN Q WEEK    . venlafaxine XR (EFFEXOR-XR) 75 MG 24 hr capsule Take by mouth daily.  2   No current facility-administered medications for this visit.    Family History  Problem Relation Age of Onset  . Heart disease Paternal Grandmother   . Heart disease Paternal Grandfather     Review of Systems  Exam:   There were no vitals taken for this visit.      General appearance: alert, cooperative and appears stated age Head: Normocephalic, without obvious abnormality, atraumatic Neck: no adenopathy, supple, symmetrical, trachea midline and thyroid {EXAM; THYROID:18604} Lungs: clear to auscultation bilaterally Breasts: {Exam; breast:13139::"normal appearance, no masses or tenderness"} Heart: regular rate and rhythm Abdomen: soft, non-tender; bowel sounds normal; no masses,  no organomegaly Extremities: extremities normal, atraumatic, no cyanosis or edema Skin: Skin color, texture, turgor normal. No rashes or lesions Lymph nodes: Cervical, supraclavicular, and axillary nodes normal. No abnormal inguinal nodes palpated Neurologic: Grossly normal   Pelvic: External genitalia:  no lesions              Urethra:  normal appearing urethra with no masses, tenderness or lesions              Bartholins and Skenes: normal                 Vagina: normal appearing vagina with normal color and discharge, no lesions              Cervix: {exam; cervix:14595}              Pap taken: {yes no:314532} Bimanual Exam:  Uterus:  {exam; uterus:12215}  Adnexa: {exam; adnexa:12223}               Rectovaginal: Confirms               Anus:  normal sphincter tone, no lesions  Chaperone, ***Terence Lux, CMA, was present for exam.  A:  Well Woman with normal exam  P:   {plan; gyn:5269::"mammogram","pap smear","return annually or prn"}

## 2020-06-03 ENCOUNTER — Ambulatory Visit: Payer: Self-pay | Admitting: Obstetrics & Gynecology

## 2020-06-03 ENCOUNTER — Telehealth: Payer: Self-pay

## 2020-06-03 ENCOUNTER — Encounter: Payer: Self-pay | Admitting: Obstetrics & Gynecology

## 2020-06-03 NOTE — Telephone Encounter (Signed)
Patient did not keep AEX appointment for today.  

## 2020-07-26 ENCOUNTER — Ambulatory Visit: Payer: BC Managed Care – PPO | Admitting: Obstetrics & Gynecology

## 2022-07-21 ENCOUNTER — Ambulatory Visit (INDEPENDENT_AMBULATORY_CARE_PROVIDER_SITE_OTHER): Payer: Self-pay | Admitting: Clinical

## 2022-07-21 DIAGNOSIS — F419 Anxiety disorder, unspecified: Secondary | ICD-10-CM

## 2022-07-21 NOTE — Progress Notes (Signed)
[facility] Behavioral Health Counselor Initial Visit  Name: Erika Orr Date: 07/21/2022 MRN: [id number] DOB: 04/10/1991  PCP: Dois Davenport, MD Time Spent: 10:10  am - 10:51 am: 41 Minutes    CPT Code: 74259 Type of Service Provided Psychological Testing (Intake visit) Type of Contact virtual (via Caregility with real time audio and visual interaction)  Patient Location: at home Provider Location: office   Visit Information: Erika Orr presented for an intake for an evaluation. Erika Orr verbally consented to telehealth. Confidentiality and the limits of confidentiality were reviewed, along with practice consents.   Background information and information about concerns was gathered. Safety concerns were not reported.   Specifics of proposed evaluation discussed with patient and  patient and examiner agreed to move forward with an evaluation. Please see below for additional information.   Of note, because part of this evaluation includes a reevaluation for ADHD on the day of the in person evaluation visit Erika Orr will delay taking her medication to be able to complete the CNS Vital Signs first with a lower medication load.   Intake for an Evaluation  Reason for Visit: Erika Orr was seen for an intake for an evaluation. Concerns expressed by Erika Orr include attention and focus, mood, and anxiety.  Relevant Background Information The following background information was obtained from an interview completed with Erika Orr. The accuracy of the background information is contingent upon the reliability of the responses provided.  Mental Status Exam: Appearance:  Neat and Well Groomed     Behavior: Appropriate and Sharing  Motor: Normal  Speech/Language:  Clear and Coherent and Normal Rate  Affect: Appropriate  Mood: A bit nervous  Thought process: normal  Thought content:   WNL  Sensory/Perceptual disturbances:   WNL  Orientation: oriented to person, place, time/date, and situation  Attention: Good   Concentration: Good  Memory: WNL  Fund of knowledge:  Fair  Insight:   Fair  Judgment:  Fair  Impulse Control: Good   Reported Symptoms:  Erika Orr reported that she was first diagnosed with AD/HD in 2010 and has not been evaluated since.  Her current therapist recommended that she consider a reevaluation.  She indicated that she is currently out of college and is questioning how much her previous ADHD is continuing to impact her functioning and her current medication regimen.  Pregnancy and Birth Information Medication during pregnancy: Unknown                                   Exposure to substances or potentially harmful events in utero: Unknown Complications during pregnancy/delivery: Erika Orr is not aware of any complications other than morning sickness during pregnancy. Length of pregnancy: Full-term Complications post-delivery: No    Developmental Milestones Age of first developmental/behavioral concern: Erika Orr reported that when she was younger she did not necessarily notice any differences in her attention or focus but that the people around her seem to notice.  For example, when she was completing homework her mother always said that she was distracted by everything.  She recalls having a very challenging time focusing on things that she did not want to do but indicated that for the most part this did not have a "huge" impact on her performance in school.  Current/Past Speech/Language concerns: No    Age of first words: Within Normal Limits (WNL) Age of first 2-3-word phrases: WNL  Age of full sentences: WNL Age of walking without assistance: WNL Any  loss of previous attained skills: No    Medical History: Medical or psychiatric concerns or diagnoses: PCOS, hypothyroidism, lyme disease (exposure to Lyme disease reportedly occurred in the summer 2009 right before she entered high school ), anxiety, depression, AD/HD  Medical History from Chart:  Past Medical History:  Diagnosis  Date   ADHD (attention deficit hyperactivity disorder)    Anemia    Anxiety    Depression    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   Lyme disease    PCOS (polycystic ovarian syndrome)    Polycystic ovarian syndrome    Thyroid disease                                          Significant accidents, hospitalizations, surgeries, or infections: wisdom teeth removal, Lyme disease                       Allergies: seasonal                                                                                      Currently taking any medication: Yes -current medications include the following:  Vyvanse Spironolactone  Mounjaro 10 mg once a week Venlafaxine Custom mix - T3 and T4 (thyroid)                                                                       Current/past eating/feeding concerns: Erika Orr experienced some appetite suppression due to [facility] but feels that her current appetite is more regular                                                   Current/past sleeping concerns: Yes; Erika Orr has always been a night owl that prefers to stay up late.  She is question whether this tendency could be related to her medication.  She needs to force herself to go to bed as she often has difficulty going to sleep "on time".  She can have difficulty getting up in the morning if she stayed up very late and notices that when she has engaged in this behavior she may sometimes feel tired during the day and experience headaches.  On average, Erika Orr sleep for 7-8 hours.                            Hygiene concerns/changes: No                                           Trauma and Abuse  History: Current/past exposure to traumas and/or significant stressors (e.g., abuse, witness to violence, fires, significant car accidents: No  - health stuff started when going into high school  Abuse History:  Victim of abuse: No Report needed: No. Victim of Neglect:No. Witness / Exposure to Domestic Violence: No   Protective  Services Involvement: No  Witness to [employer] Violence:  No   Psychiatric History Current/past aggressive behavior: No                        Current/past significant behavioral concerns (e.g., stealing, fire setting, annoying other on purpose or easily annoyed by others): No   Current/past hearing/seeing things not there or expressing unusual beliefs/ideas: No                          Current/past mood concerns (depressed or unusually elevated moods): Verona reported that for the most part she is "pretty happy now".  However, she began experiencing depression in high school after she developed significant health concerns.  She described her depression at that time is not very intense.  In college, she experienced further health concerns including significant mono and was exposure to black mold in her dorm room.  She ended up having to take a break from college for health reasons and noted that during her college years her depression ramped up.  During that time, she also experienced the end of a relationship and was in a sorority and potentially drinking more frequently to deal with emotions, which coupled with these additional health concerns ended up with her "thinking about everything".  Her current depression has been somewhat better managed but has been "up and down" due to not having a job currently.                                Current/past anxiety concerns (separation, social, general): Krizia reportedly was also diagnosed with anxiety previously.  She noted that her anxiety was worse during her college career and noted that her father has a history of experiencing anxiety as well.  During college, for example, Shawnna began experiencing social anxiety and felt as though people did not like her except her.  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  In the last few months, she has had less social interaction because she is not working. Pansey reported that although her overall current anxiety seems "okay" she fears that she may be losing the gains that she made or experiencing somewhat worse social anxiety because she is not engaging socially as often as she used to.  She reported that she does not really have people her own age to interact with.   Current/past obsessions (bothersome recurrent and persistent thoughts) or compulsions: Hilaria reported that she apologizes more frequently than she needs to and frequently worries that she will be a burden to others but reported engaging in no compulsive behaviors.   Concerns regarding attention/focus/impulsivity: Virgil's AD/HD diagnosis was reportedly provided in 2009 or 2010 (examiner requested a copy of the report during the intake). Malala reported that she has been on Vyvanse since that time and indicated that it was somewhat challenging for her to remember what her challenges were prior to Vyvanse.  However she  did recall having attention difficulties and becoming easily distracted.  For example, she reported that her "brain goes different places" and that she would procrastinate and in college in college ended up having to write papers at the last minute (for example).  She reported that she continues to feel that she experiences some symptoms related to ADHD but it can be difficult for her to tell because she has been on medications for so long.  Current/past social concerns and/or restricted or repetitive behaviors: Tonilynn reported that she has always made friends easily and gets along well with others.  She is usually able to find things she has in common with people.  However after some negative experiences in her sorority in college where she felt rejected, she has found herself to be more hesitant and not as confident with people.  She reported having no concerns about her conversational skills  or eye contact and denied any sensory interests or aversions and repetitive behaviors.    Current/past substance use/abuse: No                             Current/past legal involvement or issues: in 2015 or 2016 Salina had a DUI.  She described this experience as a wake-up call that shocked her out of some of her depression.   Risk Assessment: Current/past suicidal ideation: No                                                                                  Current/past homicidal ideation: No   Danger to Self:  No Self-injurious Behavior: No Danger to Others: No Duty to Warn:no Physical Aggression / Violence: None reported Access to Firearms a concern:  None reported Gang Involvement: None reported  While future psychiatric events cannot be accurately predicted, the patient does not currently require acute inpatient psychiatric care and does not currently meet [facility] involuntary commitment criteria.  Past Interventions Current/past services/interventions: Khilynn is currently in mental health therapy  - no other services     Outpatient Providers: Dr. Richardson Dopp whom Anjeanette worked with on and off when she was younger and has been working with her regularly for the last year.   History of Psych Hospitalization: No                                              Work, Programmer, multimedia, and Assessment History   Current school attendance: No -  graduated from college  - English lit major and Advice worker Concerns: No  Public or private school: Kavitha went to private school ([employer]) for most of her schooling.  However she did spend a year at boarding school during high school. Ever repeated a grade: went to boarding school for her sophomore year of high school and ended up having to repeat her sophomore year because her credits did not transfer   Records of prior testing: Yes - records requested     Current/past IEP or 504 Plan:  N/A  Any  formal or informal accommodations/support in school or out of school (e.g., private tutoring): Ventura was allowed extra time reportedly but never used it.  She regularly engaged in after school tutoring because being in this controlled setting helped her to focus and complete her homework/schoolwork.  Work history/current work: Marionette is not working currently.  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  She reported that the environment was very chaotic and her work days were very long.  This became too much for her and she became overwhelmed, felt that she could not handle working, and made the decision to leave.  After leaving her job she described herself as "kind of a mess" so she took some time to try to improve her health.  She has begun starting to look for work again.  Military Service: No   Family and Social History                                                                   Language(s) spoken in the home/primary language:  English  With whom does the individual reside:  live with parents right now                                                                        Family History:  Family History  Problem Relation Age of Onset   Heart disease Paternal Grandmother    Heart disease Paternal Grandfather    Medical/psychiatric concerns in immediate family history:  dad has anxiety    Medical/psychiatric concerns in extended maternal family history:   No  Medical/psychiatric concerns in extended paternal family history: heart disease               Consultations necessary/requested: Yes  - An attempt will be made to gather information from Rhiannon's parents    Relationship Status: was dating someone on and off for 5 years but a few months ago he left to go to [place]   If a parent, number of children / ages: no   Pronouns/Gender Identity: she/her  - female, straight   Support Systems: Nillie's support was previously her boyfriend that recently left  the area.  Her current supports are her parents.  She has friends but they do not live locally and she does not have local same aged peers that she spends time with.    Financial Stress: No   Any cultural differences that may affect treatment:  not applicable   Recreation/Hobbies: Varnell described herself as "artsy".  She loves creative writing and painting, for example.  She also likes sports activities such as Pilates.    Stressors in last 6 months: Malayzia has experienced a number of stressors in last 6 months including the end of her relationship, leaving her job, and then stressors related to her feelings about living at home at her age.  Issis reported that in general she is very hard on herself.    Strengths: Mettie described herself as an  honest and loyal person that cares quite a bit about others.  She reported that due to her caring nature the lack of a solid friend group to spend time with has caused her to feel like something in her life is missing currently.    Plan: Intake completed on 07/21/22. Concerns noted at the time included anxiety, depression, and a past history of ADHD. Erika Burtonmily reported that she was last evaluated for ADHD in 2010 and has not had any evaluation since then.  She has been on Vyvanse since her initial diagnosis but now is out of college and is questioning the impact of her ADHD presentation as well as her current medication regimen.  She also has a history of experiencing anxiety and depression and although these things were somewhat better she has experienced several recent stressors that have caused her to feel concerned that her anxiety and depression may not be as well-controlled as it has been previously.  Thus, Erika Burtonmily will return for an evaluation focused on potential attention deficit/hyperactivity disorder, anxiety, and/or depression.    Testing is expected to answer the question, does the individual continue to meet criteria for ADHD, as well as currently meet  criteria for mood disorder and/or anxiety disorder when age, other concerns, and cognitive functioning are taken into consideration? Further testing is warranted because a diagnosis cannot be given based on current interview data (further data is required). Psychological testing results are expected to answer the remaining diagnostic questions in order to provide an accurate diagnosis. Psychological testing results are expected to assist in treatment planning with an expectation of improved clinical outcome.   Current working diagnosis: Anxiety F41.9   Diagnoses to consider  Current Attention Deficit Hyperactivity Disorder F90. R/O Generalized Anxiety Disorder F41.1 R/O Mood Disorder F39 R/O Social Anxiety Disorder F40.10 R/O Major Depressive Disorder F33  Proposed Test Battery:  Stanford-Binet Intelligence Scale - 5 OR WAIS-4 CNS Vital Signs Connor's  DSM-5 Cross Cutting Measure and indicated follow up measures Adult AD/HD Rating Scale  Symptom Questionnaire   Ronnie DerbyEileen Leuthe, PhD

## 2022-07-29 ENCOUNTER — Ambulatory Visit: Payer: Self-pay | Admitting: Clinical

## 2022-08-24 ENCOUNTER — Ambulatory Visit (INDEPENDENT_AMBULATORY_CARE_PROVIDER_SITE_OTHER): Payer: BC Managed Care – PPO | Admitting: Clinical

## 2022-08-24 DIAGNOSIS — F419 Anxiety disorder, unspecified: Secondary | ICD-10-CM

## 2022-08-24 NOTE — Progress Notes (Signed)
Testing Visit Documentation    Name: Erika Orr     MRN: [id number]  Date of Birth: [birth year]     Age: 32 y.o.  Date of Visit 08/24/22    Type of Service Provided Psychological Testing  Type of Contact: in-person  Location: office Those present at Session: Monico Hoar   Session Note: Erika Orr for testing session. The following tests were administered and/or scored: CNS Vital Signs, Stanford-Binet 5, DSM-5 Cross Cutting Measure, Connors, AD/HD Rating Scale Erika Orr  also participated in a semi-structured interview regarding symptoms of ADHD, anxiety, mood.  The following assessments were sent: BRIEF (self and informant report) The following assessments were taken by the patient: Developmental history form, ADHD symptom form for each parent -requested copy of her original ADHD diagnostic evaluation  Mental Status Exam: Appearance:  Casual and Well Groomed     Behavior: Appropriate  Motor: Normal  Speech/Language:  Normal Rate; however, Erika Orr seemed to have difficulty fully explaining herself at times  Affect: Appropriate  Mood: normal  Thought process: normal  Thought content:   WNL  Sensory/Perceptual disturbances:   WNL  Orientation: oriented to person, place, time/date, and situation  Attention: Fair  Concentration: Fair  Memory: [place] of knowledge:  Good  Insight:   Fair  Judgment:  Fair  Impulse Control: Good    Plan: Erika Orr will return for feedback in 2 weeks.   A report will be included in the chart once the evaluation is complete.   Time Spent: Test Administration (Face-to-Face): 08/24/2022; 9:15 am -12:35 pm (200 minutes)  Scoring (non-face-to-face): 08/24/2022;1:20 pm -1:50 pm (20 minutes)  Initial integration/Report Generation: 08/24/2022; 2:20 pm -2:50 pm, 4:35 pm - 4:55 pm, 7:30 pm - 8:10 pm & 9:20 pm - 9:45 pm (115 minutes)  To be billed once evaluation is complete on last date of service:  96136 = 1 unit  96137 = 6 units 96130 = 1 unit 96131 = 1  units  Information  Given information obtained during the intake interview, additional information was gathered from Erika Orr regarding the symptoms of ADHD, anxiety, mood. This is a portion of a more comprehensive evaluation and should not be interpreted in isolation.. Please see the completed diagnostic evaluation for more information.  Erika Orr reported that anxiety is a longstanding challenge for her, but social anxiety specifically started when she was in college.  She graduated in 2018 but continues to be affected by her experiences there.  Her increased isolation due to COVID-19 seem to exacerbate some of her challenges and currently she remains somewhat isolated.  Previously, Erika Orr worked at a [employer] but the 10-hour days were too much for her and she is currently not working.  However, working at [employer] was helpful for her social anxiety because she had interact with people regularly.  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  Without this regular exposure, however, she has fears that her social anxiety will return to the same degree it was before.  She described her anxiety and mood concerns as being ongoing issues for several years with "spikes" in challenges periodically.  Between the intake and evaluation visits, Erika Orr reported that her grandmother has not been doing well health wise which has added to family stress.  In addition, Erika Orr described having some challenges knowing how to interact with people  her age and feeling negatively about her current life circumstances (e.g., she will tell herself things like she should have moved out during [place]).   Concerns about other areas such as thought problems or autism spectrum were denied by Erika Orr.   Semi-structured AD/HD Interview - Self-Report  A1. The  symptoms of inattention include: often fails to give close attention to detail or makes careless mistakes; often has difficulty sustaining attention; often does not seem to listen when spoken to; often does not follow through or fails to finish tasks; often has difficulty organizing tasks and activities; often avoids/dislikes tasks that require sustained mental effort; often loses things necessary for tasks; is often distracted by extraneous stimuli; and/or is often forgetful in daily activities.   - hard to remember what she was like before medication   Do you: Fail to give attention to detail or makes careless mistakes: Yes    Have difficulty sustaining attention: brain always wanders a little but can generally focus on the task   Have trouble listening when spoken to (mind is elsewhere): not most of the time - loves people and loves connecting with people so paying attention to that is important - maybe once in a while   Does not follow through with instructions and does not complete tasks: sometimes, even with fun things - like art and start it and have to wait for it to dry so if not in the zone anymore wont complete -  - some hyper focusing- used to do that with writing stories too - could "get in the zone" and focus but then may not come back if has to stop - focused on something and then interrupted and the worried when come back wont be the same - get annoyed -interruption might knock her out of what she is doing and then could be hard to get back to it  Have difficulty organizing tasks: not a super organized person - room is a bit messy  -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, medical - if teacher said cannot do it in one night would do it in one night - not my strong suit - dive in without thinking and then think should have planning it out a bit  Avoid tasks that require mental effort: Yes  - see something challenging and brain is like can't do it so will just guess rather than trying to figure it out  just so it is done - today when felt like cannot figure stuff out felt stupid and then wanted to move one - not with most things  - procrastination sometimes based on it being hard - growing up could go to class and pay attention and would not study at home - put if off - could not make herself do it - knew it some of the time - could glide by - could absorb a lot of it from notes  - wold make flash cards for Spanish class and then would not use them   Often lose or misplace belongings: No  Become distracted easily distracted (including being distracted by thoughts for adolescents/adults): Yes  - when talking to people have all these other thoughts pop up - mostly good things - like if excited and enjoying talking to someone is making connections in head about other things they can talk about  - anxiety thoughts usually only when don't know someone well and don't know what they think of me  Often seem forgetful: when interested can remember easily (can remember all about  Erika Orr) but if boring just want to get it over with - remember things fine but don't put as much attention and focus on things that are boring - not rewarding immediately goes to the back of her head - could go to bed now and feel good tomorrow or talk to this person because it is fun right now - goes for short term reward even if know long term not beneficial     Age of Onset:  Age of individual when symptoms were first noticed: she did not realize it but saw mom's reactions to her - mom got frustrated with her being unable to stay focused on projects and homework - around 5th grade  - remember mom saying she was distracted and mom was frustrated - stands out to her because she thought it was normal but mom's reaction suggested it was not Over time (better/worse/the same): don't know - been on same does of medication for years - maybe worked better at first but don't know  - depends on what is going on and what have to get  done - 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 ever for her "job so boring for me" so was looking for distraction   Any of the above symptoms not present in childhood: don't know    Any of the above symptoms that were present in childhood but are not now: can kind of force self to stay on task with important things - "younger me may not have been able to do that"  - more chill now than used to be     Impact on functioning, settings that behaviors are noticed  School/work: maybe not reaching potential could reach - did not do badly at school but not focused on getting As either - part of why get hard on self - makes here feel like she is lazy and not going at full effort towards things - know when to be places and can tell time but at work at [employer] the management talked to her about being late regularly -   - don't want to run late - know what time it is but maybe spend too much time and lose track of time  - maybe underestimate time as well  Home: family gets annoyed with her - especially mom who stayed home - would say why can't you get this done or stay focused - feel like annoying them with stuff can't help - parents are very organized people and has clashed with her sometimes     Peers: No    A2. The symptoms of hyperactivity-impulsivity include: often fidgets, taps hands or feet, or squirms in seat; often leaves seat when remaining seated is expected; often runs/climbs in situations where it is inappropriate; often unable to play or engage in in leisure activity quietly; is often on the go; often talks excessively; often blurts out answers before the question is completed; often has difficulty waiting for his/her turn; often interrupts or intrudes on others.  Do you: Fidget: have the shaky leg at times   Leaves seat unexpectedly: No  Feel restless: No   Have difficulty engaging in quiet activities: No  Seem to be often on the go : No  Talk  excessively: Yes  - love to talk but lately with being isolated with parents has gotten worse - less people talk to them more - if excited too will talk more   Blurt  out answers: sometimes -   Have difficulty waiting turn: No  Interrupt often: try not to interrupt but as people are completing their sentences she will jump in - cares about what they are saying but may come off like I don't sometimes   Age of Onset:  Age of individual when symptoms were first noticed: when younger was sillier and more talkative - did not have as much social anxiety - but was not running around and doing hyperactive type of stuff   Anxiety  Recently anxiety is not as bad but hasn't been around people- currently am anxious about being anxious  - bottle it up    GAD: Excessive worry: big thing worry about is moving out on own - being successful - feel like parents love her and are trying to help even though don't understand everything - worry about being self reliant - have a job and what if screw it up some how  -Worry will be isolated being in own place without people to hang out with   More than 6 months: stated in freshman year of high school  - summer before freshman year (summer 2009) got bit by a tick and got Lyme disease and lots of health things - in November child in grade committed suicide and felt like it all came together to cause anxiety  - think it made her really depressed too - why is all this happening to me   Worry is hard to control: depends - can handle some stuff but if start really talking or thinking about it then has a hard time  - last 6 months anxiety has been a bit worse due to stress with grandmother and partner moving to [place] out of nowhere - lots going on  - feel like it may not be excessive -    Physical symptoms - restlessness or on edge: No - easily fatigued: Yes  - can't concentrate: Yes - little bit - mind goes blank: sometimes when it is intense - few times  -  irritability: Yes  - especially if say she is feeling anxious and family is impatient with her - not trying to understand  - muscle tension: Yes  - sleep disturbance: not sure - stays up late but not sure if related to anxiety - night time is time to decompress  Social Anxiety:  Persistent, intense fear or anxiety about specific social situations because due to fear of being judged negatively, embarrassed or humiliated: hard on self - don't want to to into store right now because feeling anxious then get hard on self - anxious that will act weird  - 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 she acts pretty normal so not sure why worries about that  - joined sorority and felt like rejected by them and felt bad because they did not like her - felt weird - was a while ago and still feel badly about that  - went to college and was doing well, then broke up with a boyfriend and then she and another girl joined and were not assigned a big sister and did not get integrated as well - ended up hanging out and not as much with others - did not connect with the others and felt judged and they were rude about it - her friend left so she felt really alone - then got sick (black mold and mono) and sorority did not believe her when she did not show up for meetings -  then got negative with her - rejection from a lot of people  - before that felt a little bit of social anxiety - had friend in high school and everything - always hated public speaking and presentations but after sorority experience then felt rejected and weird   - always feel like could make friends and is a people person - genuinely interested in people and thought was okay person but then after that rejection felt like she must be an awful person   - tried to make friends - felt like haven't been able keep in touch with people from hight school and then feel like it is personal even though realizes that people are doing their own thing and normal to  lose touch - feels like a personal rejection- she tries to keep in touch - people don't care as much as she does about keeping up with relationships - wants to check in with people and talk to them on a weekly basis but people don't do that with her - feel like it gets one sided   Avoidance of anxiety-producing social situations or enduring them with intense fear or anxiety: don't avoid it but can be at an event and feel like really anxious and don't want to be there - especially when on her own - feel better if have someone there with her - feel overwhelmed by making small talk with all these random people - worried they are going to judge her for saying something silly - knows she has manners and does not say outrageous things - scary walking into a room of people that don't know - feels like that is hard   - can tell self she is a likeable person and not weird but a work in progress - maybe limited her - wants to go out and be social but now is really hard to find a place to make friends - looked up groups and clubs but is intimidating to go - if could get there would be fine but worried about acting weird and embarrassing self  - traveled on own - went to [place] in 2017 - was anxious but so much fun -   Excessive anxiety that's out of proportion to the situation: Yes   Anxiety or distress that interferes with your daily living: Yes  - then feel bad if can't do stuff and would have fun    Mood  Symptoms of Depression  Sadness, crying, down mood: not intensely depressed but not happy where am in life - frustrated with self - have stuff want to do and feel like not doing  Feelings of hopelessness, worthlessness, and guilt: No - maybe some "you should be ..."  Loss of energy: Yes - sleep schedule is really bad  Loss of interest or pleasure in everyday activities: No Trouble concentrating and making decisions: No Irritability: get irritable because mom is micro manager-she is hard on self - what  I have gotten done today - more stressed about it  Need for more sleep or sleeplessness: staying up late - don't know why  Change in appetite: No Weight loss/gain: No Suicidal thoughts and attempts at suicide: No  Currently, Erika Orr described her mood as "not intensely depressed" but also not happy about where she is in life.  She reported that she feels frustrated with herself because there is things she wants to do but she feels like she is not doing them.  She experiences some guilt or feelings of "you  should be..." There are times that she feels irritable because she feels that her mother is a [employer] manager" but also can be hard on herself which then increases her stress.  Currently, Erika Orr described that her sleep schedule is very poor.  However, she has not experienced a change in her appetite, gained or lost weight, or lost interest in previously pleasurable activities.  Suicidal thoughts were also denied.  Onset of symptoms: high school first time - looking back felt like dealing with all the health stuff made her grow up really quick - all this stuff at once  - more up and down since then - feel like don't have a purpose and am not accomplishing stuff so then worries mood will get really bad for now - think about applying for jobs but then gets anxious that will mess up or will be late too much and they get mad   Symptoms of Mania Extreme happiness, hopefulness, and excitement: No    Zara Chess, PhD

## 2022-09-03 ENCOUNTER — Ambulatory Visit: Payer: BC Managed Care – PPO | Admitting: Clinical

## 2022-09-10 ENCOUNTER — Ambulatory Visit (INDEPENDENT_AMBULATORY_CARE_PROVIDER_SITE_OTHER): Payer: BC Managed Care – PPO | Admitting: Clinical

## 2022-09-10 DIAGNOSIS — F419 Anxiety disorder, unspecified: Secondary | ICD-10-CM

## 2022-09-10 NOTE — Progress Notes (Signed)
Testing Visit Documentation    Name: Erika Orr      MRN: [id number]  Date of Birth: Apr 20, 1991    Age: 32 y.o.  Date of Visit 09/10/22    Type of Service Provided Psychological Testing (Feedback session) Type of Contact: virtual (via Webex with real time audio and visual interaction)  Patient Location: at home Provider Location: office Those present at Session: Erika Orr   Visit Information: Session was conducted via telehealth. Erika Orr for the results of the evaluation.   Results of the assessment so far were reviewed and interpreted for Erika Orr. However, Erika Orr did not complete several forms that would be helpful in further understanding her results. As such, Erika Orr and the examiner agreed that results would be considered preliminary and set a meeting for next week to review additional findings based on additional forms (during the visit, Erika Orr reported that she had received the questionnaires). Preliminary recommendations were provided.   Please see the completed report (which will be finalized after the meeting next week) for more detailed information regarding background information, testing results and interpretation.   Plan: Erika Orr acknowledged the forms were received and agreed to complete them - examiner and Erika Orr agreed to meet next week to review finalized evaluation results.    Time Spent as part of the current visit:  Integration/Report Generation: 09/04/2022, 2:40 pm - 3:30 pm, 09/07/2022, 2:15 pm - 4:00 pm, 09/09/2022, 9:00 am-10:00 am (155 minutes)  Time spent in Initial Interactive Feedback Session: 09/10/2021, 11:00 am - 12:20 pm (80 minutes)   To be billed once evaluation is complete on last date of service:  96131 = 4 units     Erika Chess, PhD

## 2022-09-15 ENCOUNTER — Ambulatory Visit (INDEPENDENT_AMBULATORY_CARE_PROVIDER_SITE_OTHER): Payer: BC Managed Care – PPO | Admitting: Clinical

## 2022-09-15 DIAGNOSIS — F419 Anxiety disorder, unspecified: Secondary | ICD-10-CM

## 2022-09-15 DIAGNOSIS — F908 Attention-deficit hyperactivity disorder, other type: Secondary | ICD-10-CM | POA: Diagnosis not present

## 2022-09-15 NOTE — Progress Notes (Signed)
Testing Visit Documentation    Name: Erika Orr       MRN: [id number]  Date of Birth: 03/09/1991     Age: 32 y.o.  Date of Visit 09/15/22    Type of Service Provided Psychological Testing (Feedback session) Type of Contact: virtual (via [place] with real time audio and visual interaction)  Patient Location: home Provider Location: office  Those present at Session: Monico Hoar   Visit Information: Session was conducted via telehealth. Sahithi presented for the second part of the feedback. No new concerns were reported since last visit.  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 Recommendations were provided. A full written report will be completed and shared with [name]. Please see the completed report for more detailed information regarding background information, testing results and interpretation.   Plan: Evaluation complete - appropriate referrals and recommendations for next steps made.     Time Spent as part of the current visit: Additional Scoring (non-face-to-face): 09/15/2022, 12:30 - 12:40 pm (10 minutes)  Additional Integration/Report Generation: 09/15/2022, 9:00 pm - 9:30 pm (30 minutes)  Additional Interactive Feedback Session: 09/15/2022, 1:00 pm - 1:35 pm (32 minutes)   Please see the notes from dates of services 08/24/2022 and 09/10/2022 for additional documentation of times spent and units that are to be billed  Total billing (including from the current session and prior dates of service listed above) is as follows:  Total spent in Test Administration and Scoring across all testing visits: 230  minutes  Units billed: 96136 = 1 unit  96137 = 7 units  Total time spend in Testing Evaluation Services including, but not limited to, the integrative  feedback session and integration/report generation: 415 minutes  Units billed: 39767 = 1 unit 96131 = 5 units     Zara Chess, PhD

## 2022-09-29 ENCOUNTER — Telehealth: Payer: Self-pay | Admitting: Clinical

## 2022-09-29 NOTE — Telephone Encounter (Signed)
LM indicating had received release from [facility] treating psychologist and was planning on talking with them later in the week regarding results and to please call by 10/01/2022 if there were concerns about this. Also indicated has received the questionnaire back from parent and would work to incorporate it into the report and complete the report soon. Requested call back with any questions or concerns.   Zara Chess, PhD

## 2022-10-02 ENCOUNTER — Telehealth: Payer: Self-pay | Admitting: Clinical

## 2022-10-02 NOTE — Telephone Encounter (Signed)
After receiving written release touched base via phone with treating therapist (Dr. Ernestene Mention, 10/01/2022 @ 2:15 pm). Therapist described some observations of Erika Orr, which seemed consistent with results obtained by this clinician. Some next steps were discussed including increasing the frequency of sessions and addressing anxiety and executive functioning weaknesses.   Zara Chess, PhD

## 2022-10-05 NOTE — Progress Notes (Signed)
Addendum to feedback note:  Post Service Work and report completion occurred on:  09/30/2022, 8:20 am - 9:00 am, 10/02/2022, 2:50 pm - 4:30 pm , 08/03/2023, 12:30 pm - 1:35 pm & 4:20 pm - 5:00 pm, 10/04/2022, 10:30 am - 12:30 pm & 8:30 pm-9:00 pm  (395 minutes).  Report shared with front desk to be sent to client on 10/05/2022   Zara Chess, PhD              Zara Chess, PhD

## 2022-10-05 NOTE — Progress Notes (Signed)
____________________________________________________________________________________   CONFIDENTIAL PSYCHOLOGICAL ASSESSMENT1The assessment results are confidential.  This report is not to be copied in whole, or in part, nor discussed without the consent of the parent/guardian or the individual (if 18 years or older).  As children grow and mature, after several years some of the assessment results may become less valid, at which time they are best regarded as useful background information.  Name: Erika Orr       MRN: [id number]  Date of Birth: 08-14-1991       Age: 32 years  Dates of Evaluation: 07/21/2022,08/24/2021, 09/10/2022, 09/15/2022  Date of Report:  10/02/2022 Psychologist:  Zara Chess, PhD     Psychology License # [id number], Health Services Provider Certification: HSP-P  Reason for Evaluation Erika Orr was seen for an evaluation at [facility] to obtain updated information and further explore challenges with attention/focus, mood, and anxiety. Her current therapist reportedly recommended updated evaluation to better understand Erika Orr's current profile, and Erika Orr was hoping that further evaluation could help her understand how previously identified challenges are impacting her current functioning.     Relevant Background Information The following background information was obtained from interviews completed with Erika Orr, a Developmental History Form completed by Erika Orr's mother Erika Orr), and some questionnaires completed by her parents. The accuracy of the background information is contingent upon the reliability of the responses provided as well as the validity of the information contained in previous records.  Pregnancy and Birth Information Medication during pregnancy: medication for nausea and acid reflux.  Exposure to substances or potentially harmful events in utero: No Complications during pregnancy/delivery: Erika Orr's mother had hyperemesis gravidarum for the entire  pregnancy. She also had severe acid reflux and migraines with vision disturbances. During delivery Erika Orr was in the occiput posterior position.     Length of pregnancy: full term                                                                       Delivery method: vaginal  Birth weight: 8 lbs. 11 oz Complications post-delivery: Post birth, Erika Orr developed jaundice but did not require treatment. She was given antibiotics related to an infection that her mother had at the time of delivery. She developed a cold by the time that she was discharged from the hospital. When she was 30 weeks old, she had strep throat.   Developmental Milestones History of developmental/behavioral concerns: Erika Orr reported that when she was younger, she was less aware of her challenges with attention/focus. However, the people close to her noticed and commented about some differences (e.g., her mother reportedly told Erika Orr that she was distracted by everything when she was completing homework). Erika Orr recalls having significant challenges focusing on tasks that she did not want to engage with in childhood, though this tendency did not have a "huge" impact on her performance in school. She was reportedly diagnosed with AD/HD in 2010. Anxiety and mood concerns were also noted, with significant mood and anxiety concerns beginning during Erika Orr's freshman year of high school. Notably, the summer before her freshman year (summer 2009) Erika Orr contracted Lyme disease, and during school she developed several health issues and was exposed to some other stressors. Erika Orr reported that her exposure to these stressors significantly increased her  anxiety and symptoms of depression. Periodic concerns in these areas have continued since that time. For example, Erika Orr reported an increase in social anxiety beginning in college related to some negative experiences, which have continued to impact her. Anxiety challenges were again exacerbated by isolation  caused by COVID-19, and even after pandemic restrictions eased, Erika Orr continued to experience isolation. In addition, although some recent experiences helped reduce Erika Orr's social anxiety (i.e., working at a [employer] required Erika Orr to interact with people regularly at work and helped her to realize that "no one cared" much about her behaviors), she has fewer opportunities now and fears that her social anxiety will return to prior levels (e.g., Erika Orr ended up leaving her job after feeling that the 10-hour days were "too much" for her). Between the intake and evaluation visits, Erika Orr reported that health concerns for her Erika Orr had led to added to family stress.   According to paperwork completed by her mother, Erika Orr had difficulty sitting still in kindergarten, but described Erika Orr's kindergarten class as "boring". When she was younger, Erika Orr was a good Erika Orr, though in lower school "every other year was the push year" during which Erika Orr had minor learning challenges. By high school Erika Orr had noticeable difficulties focusing and completing homework. She started treatment for AD/HD in the 10th grade.   Current/Past Speech/Language concerns: No Age of first words: 10 months Age of first 2-3-word phrases: around 4 years  Age of walking without assistance: 11 months Any loss of previous attained skills: According to paperwork completed by her mother, in very early childhood Erika Orr and her family moved to house that "probably had mold". After Erika Orr's 3st birthday (Fall of 1993) Erika Orr became "very sick all the time", had "constant ear infections," and stopped walking and talking as much. By the Spring, however, Erika Orr's heath improved, and she seemed to be "back to normal."  Medical History: Medical or psychiatric concerns or diagnoses: Erika Orr reported that she has been diagnosed with PCOS, hypothyroidism, Lyme disease, anxiety, depression, and AD/HD. According to paperwork completed by her mother,  when Erika Orr was 98 months old, she had "constant" ear infections for a 62-monthperiod. In addition to the above diagnoses, her mother noted that ELaporchehad allergies to mold.                                          Significant accidents, hospitalizations, surgeries, or infections: wisdom teeth removal, Lyme disease Allergies: seasonal       Currently taking any medication: Vyvanse, Spironolactone, Mounjaro, Venlafaxine, and a custom thyroid medication.                                                                                  Current/past eating/feeding concerns: EJatziryexperienced some appetite suppression due to [facility] feels that her current appetite is more consistent.  Current/past sleeping concerns: Britt has always been a night owl that prefers to stay up late; she often must force herself to go to bed and has difficulty going to sleep "on time". She has questioned whether this tendency could be related to her medication. After late nights, Khaleesia sometimes has difficulty getting up in the morning, may feel tired during the day, and/or sometimes experiences headaches. On average, Baili sleep for 7-8 hours per night. Hygiene concerns/changes: No                                                                               Psychiatric History Current/past aggressive behavior: No                                                                               Current/past significant behavioral concerns (e.g., stealing, fire setting): No Current/past hearing/seeing things not there or expressing unusual beliefs/ideas: No         Current/past exposure to traumas and/or significant stressors (e.g., abuse, witness to violence, fires, significant car accidents: Tanaijah reported some significant stressors related to heath challenges at the beginning of high school.                                                                      Current/past mood concerns (depressed or unusually elevated moods): Rickea reported that her first experience of significant depression occurred around her freshman year of high school after she developed significant health concerns (including contracting Lyme disease the summer before her freshman year). Her mood was variable after her freshman year, with symptoms escalating during her college years after she experienced another period of health concerns (including contracting significant mononucleosis and exposure to black mold in her dorm room), along with other stressors including the end of a relationship and challenges with her sorority. Yliana also was potentially drinking more frequently at that time to deal with her emotions. She ended up having to take a break from college for health reasons.   More recently, Zineb reported somewhat improved mood, though her mood has also been "up and down". She continues to struggle with negative thinking and feeling rejected during some social exchanges. For example, there are friends from high school that she has tried to reach out to, but the responses she received have not been what she hoped for. Despite knowing that it is typical to lose touch with people over time and/or that the people she is reaching out to may be busy with their own things, she takes their responses and/or lack of response personally and feels rejected. She also described other negative feeling related to being without a  job or " purpose," and/or feeling that "I am not accomplishing" things. She experiences irritability when her family is impatient with her when she is feeling anxious.                             Current/past anxiety concerns (separation, social, general): As described above, Amya reported that she first experienced significant anxiety during her freshman year of high school and had another significant stretch of anxiety during college. Current anxieties  experienced by Erika Orr include fears about moving out on her own (because she fears that she will be isolated), being self-reliant, the future, and being successful (e.g., she worries about having a job and what would happen if she were to "screw it up somehow"). She also described currently being "anxious about being anxious". For example, her lack of a job leads to negative feelings which then leads to anxiety about her depression escalating again. On the other hand, thinking about applying for jobs causes Sadee to experiences worries about messing up at work (e.g., fears that she will be chronically late and then get into trouble) which then makes it challenging for her to apply. She will try to manage anxiety by not thinking about things she worries about or bottling up her anxiety, as talking or thinking about her worries can lead Reinette to have difficulty controlling her anxiety. In the last 6 months, her anxiety has been a bit worse due to stress associated with her Erika Orr and the ending of a relationship. Given the stressors she has been exposed to recently Jamin is unsure whether her current level of anxiety is excessive. When experiencing anxiety, Anndrea may also feel fatigued, have slightly more difficulty concentrating, and/or experience some irritability and muscle tension. Nalayah reported that she has been staying up late but was unsure about whether this was related to anxiety.  As noted, in addition to more general anxiety, Mionna has experienced anxiety specifically about social interactions. She started to experience more significant social anxiety in college after her experience with a sorority resulted in her feeling "rejected and weird" and that people did not like her. Although she had experienced some social anxiety prior to college (e.g., she always hated public speaking and presentations) her level of social anxiety in college exceeded her experience of social anxiety in high school. Prior  to college, Otto had always felt that she could make friends and was a "people person" that was genuinely interested in others. After her negative sorority experience, she questioned these things about herself and felt that she must be "an awful person". 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 At that job she was required to frequently interact with people. Frequent practice interacting with others helped the process feel easier, and the exposure helped her to realize that her behaviors were not highly unusual. Recently, Krishika's social anxiety has been less significant, but it is difficult to interpret this as Enez indicated that her lower levels of anxiety were likely related to being around fewer people and having fewer social interaction (she does not really have people her own age to interact with). Taka reported fears that these reduced social opportunities and interactions may result in the resurgence of anxiety. Further, Siboney continues to be very hard on herself, worries that she will "act weird" during social exchanges, and may avoid some social interactions. For example, when she  is at an outing or event, she prefers to have someone with her, as being at events alone results in Erath feeling anxious or not wanting to be there. For example, it is scary and very challenging for Kristanna to have to walk into a room full of people that she does not know, and she may feel overwhelmed about having to make "small talk" with "random people". In these situations, she worries that others will judge her for saying something "silly" despite knowing that she has "manners" and does not say "outrageous things." She has been working on telling herself that she is a likable person and "not weird" but described this as a "work in progress". She feels that these worries about social interactions are limiting her, because she wants to go out and  be social, but finds it challenging to do so. For example, she has looked up various clubs and groups she could join or explore but worries about "acting weird and embarrassing" herself and is "too intimidated" to go, despite recognizing that she would probably have fun if she were to attend. She then feels badly about not attending activities that she knows that she would enjoy. Notably, there have been times when Janeah was able to overcome her anxiety and do something she wanted to, such as traveling on her own to [place].   Current/past obsessions (bothersome recurrent and persistent thoughts) or compulsions: Shevette reported that she apologizes more frequently than necessary and frequently worries that she is a burden to others. No compulsive behaviors were reported.    Concerns regarding attention/focus/impulsivity:  As a young child, Shaylon was not fully aware of her struggles with attention. However, Chenel recalled that by the 5th grade her mother was expressing frustration about Skilynn's difficulty staying focused on projects and homework. Until this point, Bonnielee believed that her attention span was typical, but her mother's reaction led to her question this. Dede was diagnosed with AD/HD in high school. Shortly after being diagnosed with AD/HD, Jennesa was prescribed Vyvanse and has taken this medication ever since. Because she has been on this medication for so long, Galileah has some difficulty knowing what her attention is like without medication. However, she suspects that medication may have been more beneficial for her previously than it has been more recently. Nonetheless, some attention challenges were identified by Erika Orr including that she tends to become easily distracted and feeling that her "brain goes different places." She also noticed attention symptoms in college, despite being on medication for AD/HD. For example, Grey often procrastinated and would end up writing her papers at the last  minute. She also showed differential focus as she had significant difficulty focusing on uninteresting tasks but was able to focus appropriately on interesting or engaging topics. Vernica indicated that she feels that her challenges with inattentive behaviors have impacted her ability to reach her full potential. For example, although Doretha did not do poorly in school, she was also not focused on getting A's. Currently, some of her symptoms may have improved, as Gerrilyn can sometimes "force" herself to stay on task when the task is important and noted that "younger me may not have been able to do that". However, Jeneice also reported that ongoing inattentive symptoms can impact her feelings about herself. For example, she may be hard on herself if she feels that she is being "lazy" or not giving tasks her "full effort". Dina also reported that her family members seem to become annoyed with her if she unable  to accomplish a task or stay focused, and there have been times that she feels as though she has unintentionally annoyed others with behaviors that are not fully under her control,   Regarding any hyperactive-impulsive behaviors, Shirel reported that when she was younger, she was sillier and more talkative but was not the type of child that was always running around or engaging in hyperactive behavior.  According to paperwork completed by her mother, Kalissa is bright and was a good Erika Orr. She seemed to do well with in-person lectures but had difficulty with online classes. Several symptoms of inattention were noted by her mother as well. For example, Kaylee reportedly has difficulty organizing and planning, "keeping a calendar," and being on time. Although Tyneka can focus on tasks once she begins working on them, she has difficulty starting tasks. During high school, Ruhee had difficulty focusing and completing work. For example, she would spend hours working on her homework and was easily distracted by things in  the environment (e.g., her dog, the doorbell), and once distracted she often had difficulty refocusing. After starting an afterschool study hall, her homework time decreased from "all night" to about an hour. She started being treated for AD/HD in the 10th grade, and her mother noted that her grades improved from Bs to As. As an adult, Cadie continues to struggle with symptoms of inattention including planning, keeping appointments, and completing tasks. For example, she will go upstairs to get her medications or shower but "hours later" will not have completed the task that she meant to complete.   Current/past social concerns and/or restricted or repetitive behaviors: Senta reported that she has always made friends easily, gets along well with others, and is usually able to find common interests with people. However, after some negative experiences with a sorority in college which left her feeling rejected, she has found herself to be more hesitant and not as confident with people. Adjoa reported that due to her caring nature and desire for social connections, the lack of a solid friend group to spend time with has caused her to feel like something in her life is missing. She reported having no concerns about her conversational skills or eye contact and denied any sensory interests, sensory aversions, and repetitive behaviors.    Current/past legal involvement or issues: Amanada got a DUI in 2015 or 2016, which she described as a "wake-up call" that shocked her out of some of her depression. Current/past substance abuse: No Current/past suicidal ideation: No Current/past homicidal ideation: No               Past Interventions Current/past services/interventions: Modean currently works with a Control and instrumentation engineer.              Outpatient Providers: Ernestene Mention, PsyD, whom Kenyona worked with on-and-off when she was younger and has been working with regularly for the last year.   History of Psych  Hospitalization: No                                              Work, School, and Assessment History          Current school attendance: Nelissa graduated from college with a dual major in [employer] and communications. She is not currently in school.  Academic Concerns:   No   Public or private school: Darriana went to private  school ([employer]) for most of her schooling. However, she did spend a year at boarding school during high school.  Ever repeated a grade: Arij enrolled in a boarding school for her sophomore year of high school but ended up having to repeat her sophomore year because her credits did not transfer.  Records of prior testing: Sherald was reportedly evaluated for AD/HD previously. Records were requested but reportedly could not be located.  Current/past IEP or 504 Plan:  N/A                                                                                        Any formal or informal accommodations/support in school or out of school (e.g., private tutoring): Hidayah was reportedly allowed extra time in school but never used it. She regularly engaged in after school tutoring because being in a controlled setting helped her to focus and complete her homework and/or schoolwork.   Work history/current work: Cariyah is not working currently. 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 However, the environment was "very chaotic", and her workdays were long. Eventually, Akaila felt as though these aspects of the job were "too much" for her; she began feeling overwhelmed and unable to handle working, so she decided to leave. She described that after leaving her job she was "kind of a mess" and need some time to try to improve her health. She recently began looking for work.   Military Service: No    Family and Social History                                                                                               Language(s) spoken in the  home/primary language:  English  With whom does the individual reside: Elowen lives with her parents. Medical/psychiatric concerns in immediate and/or extended family history: Anxiety, heart disease  Pronouns: she/her  Relationship Status: Ariss is currently single.  Consultations necessary/requested: Some written information was obtained from [facility] mother.  Support Systems: Aurelia's current supports are her parents.    Recreation/Hobbies: Cybelle described herself as "artsy", as she loves Radio producer and painting, for example. She also likes sports activities such as Pilates.     Stressors in last 6 months: Emmerie has experienced several stressors in last 6 months including the end of her relationship (she reported that she had been in an on-and-off dating relationships for 5 years but a few months ago her partner unexpectedly moved to [place], ending their relationship) and leaving her job, as well as stressors related to her feelings about living at home at her age. Charman reported that in general she is very hard on herself.     Strengths: Janhavi described herself as an honest and loyal  person that cares quite a bit about others.   Assessment Procedures:  Interviews with Martina Sinner Binet Intelligence Scale - Fifth Edition CNS Vital Signs  Adult ADHD Self-Report Scale (ASRS - v1.1) Symptom Checklist Generalized Anxiety Disorder score (GAD-7) Patient Health Questionnaire (PHQ-9) Conners' Adult ADHD Rating Scale (CAARS) -Short Form Symptom Checklist DSM-5-TR Self-Rated Level 1 Cross Cutting Symptoms Measure DSM-5 Depression Adult Measure DSM-5 Anxiety Adult Measure DSM-5 Sleep Disturbance Adult Measure Severity Measure for Social Anxiety Disorder (Social Phobia)--Adult Severity Measure for Generalized Anxiety Disorder--Adult Personality Assessment Inventory (PAI) The Behavior Rating Inventory of Executive Function-Adult Version (BRIEF-A)  Behavioral Observations:   Alexsia  presented to the in-person the evaluation visit. Because the evaluation was considering her AD/HD presentation Hopie delayed taking her dose of Vyvanse on the day of the evaluation. Vaughan was first provided with the computerized neurocognitive battery. Bianca showed a somewhat impulsive response style throughout the subtests. For example, during a finger tapping activity, Madhu began responding before the task began. She recognized this and commented that she started too soon. She also engaged in behaviors during testing that suggested that she was making errors but was unable to prevent them from occurring. In addition, one of the neurocognitive subtests includes 4-parts of increasing complexity, with each part having slightly different directions about what to respond to. On this task, Coralyn moved forward with the second task so quickly that neither she nor the examiner had time to read the directions. This part of the task requires individuals to respond to a particular shape but as neither the examiner nor Thienkim knew which of the shapes she was supposed to be responding to, Sapphyre was unable to complete this portion of the task. During the 4th part, Holly was observed to make several errors during the practice items and stated, "I am bad at this one". Of note, on a different subtest (Stroop task) Periann commented that she had seen the task previously.   After completion of the neurocognitive battery, Jaelynne completed cognitive testing. Her effort and engagement appeared mostly appropriate. However, it was noted that she was sometimes very quick to answer, even when tasks were complicated or challenging. More specifically, it appeared that when she was unsure about how to approach a problem, Leitha wanted to move on quickly sometimes without appearing to put forth reasonable effort to try to solve the problem. At one point, she stated that she was showing "ADD brain" because she was thinking about the color and  design of pictures that she was being shown instead of completing the task at hand. During a task that asked Lakeyn to recreate designs with plastic pieces, her hands sometimes shook. During the nonverbal subtests she also appeared to become more negative and unsure about her performance as task difficulty increased (e.g., commenting that she was "second guessing" herself, stating, "I am questioning myself so much", and saying "I told you I was bad at these"). During a task that asked her to watch the examiner tap blocks and then reproduce or manipulate the shown sequence Asmi stated that she was focusing so much on memorizing that she had difficulty watching what the examiner was doing. It was noted that during this task she often tapped the correct blocks but sometimes they were in the incorrect order, or she omitted a block when it was supposed to be tapped twice. During verbal math items Emalina struggled and sometimes appeared to get stuck when she did not know how to proceed. During a verbal  working Immunologist, Desirree indicated that she felt like she "got lost". During a semi-structured symptom specific interview, it was observed that Elwood sometimes had difficulty fully explaining herself. Socially, throughout the evaluation Keyonda displayed good social interest and made a number of appropriate social initiations with the examiner. She was also able to initiate and maintain reciprocal conversations with the examiner, during which she asked the examiner questions about her thoughts and experiences and demonstrated shared enjoyment. She also appropriately used gestures to augment her verbal communication.  Of note, at the time of the initial results meeting there were several forms that had been sent to [facility] but were incomplete at the time of the meeting. After some discussion, Kat and the examiner agreed that Angelus would complete the missing forms and a second meeting to discuss these findings was scheduled  for 5 days later. Kerrie indicated that she was planning on completing the forms immediately after the initial meeting. However, the forms did not end up being completed until late the evening before the second meeting. When the examiner asked about this, Antinette reported that although she intended to complete the forms right after the meeting, she got distracted by something else and then forgot about them and did not get back to them until right before.  Although it is believed that the below results are valid estimate of Jaime's current functioning, given her tendency to want to swiftly move past challenging problems and increasing negativity about her performance, it is possible that scores are slight underestimate of her overall abilities. As such, overall results can be interpreted with a slight degree of caution.   Assessment Results and Interpretation: Stanford Binet Intelligence Scale - Fifth Edition The Stanford-Binet Intelligence Scale - Fifth Edition is an individually administered assessment of intelligence and cognitive ability. It includes measures of both verbal and nonverbal ability. The Full-Scale IQ is derived from 10 subtests and is generally considered a standard measure of global intellectual functioning. IQ scores are standard scores, which have an average in the population of 100 and a standard deviation of 15. This means that 68% of the population has scores that fall between 85 and 115. Subtest scores have a mean of 10 and a standard deviation of 3. A percentile rank (PR) is provided for scores to show Lillyrose's standing relative to other same-age peers. The scores obtained on the Stanford-Binet reflect Savon's true abilities combined with some degree of measurement error. Her true score is more accurately represented by a confidence interval (CI), which is a range of scores within which her true score is likely to fall. It is common for individuals to exhibit score differences across  areas of performance. Scores can also be influenced by motivation, attention, interests, and opportunities for learning. All scores may be slightly higher or lower if Kemaria were tested again on a different day. It is therefore important to view these test scores as a snapshot of Lagretta's current level of intellectual functioning.   Sherlene's Full Scale IQ was average (FSIQ = 100, CI = 96-104, 50%). Notably, Cari's Full Scale IQ score is somewhat difficult to interpret due to the variability among the subtests included in this domain, and can therefore, can be interpreted with some caution. The Verbal IQ score includes subtests that assess verbal reasoning, ability to detect verbal absurdities, vocabulary, verbal quantitative reasoning, verbal visual spatial processing, and verbal working memory. The Nonverbal IQ includes subtests such as matrices, procedural knowledge, nonverbal quantitative reasoning, nonverbal visual-spatial reasoning, and nonverbal  working Marine scientist. Both Yaretzy's Nonverbal and Verbal IQ scores were within the average range but can be interpreted with some caution given some variability among the scores included in each domain.   The Stanford-Binet also provides information about an individual's abilities in five cognitive domains: Fluid Reasoning, Knowledge, Quantitative Reasoning, Visual-Spatial Processing, and Working Memory. The Fluid Reasoning scale measures an individual's ability to solve verbal and nonverbal problems using inductive or deductive reasoning. Subtests include skills such as classifying objects into groups and detecting and explaining absurdities from pictures or information. The Knowledge factor assesses an individual's fund of information (acquired from home or school). These subtests include measures of an individual's vocabulary and procedural knowledge. The Quantitative Reasoning domain assesses an individual's verbal and nonverbal ability to apply mathematical problem  solving. The Visual-Spatial Processing domain measures an individual's ability to see patters and relationships. Subscales on this domain may include recreating a two-dimensional visual pattern with movable pieces. The Working Memory domain assesses an individual's ability to temporarily store and transform or sort information in memory. This score can be interpreted with some caution given the variability of the scores included in this domain. Overall, Anila's scores were variable ranging from the low average range to high average.    Standard Score (Confidence Interval) Percentile Descriptive Range  Full Scale IQ 100 (96-104) 50 Average  Nonverbal IQ 96 (90-102) 39 Average  Verbal IQ 103 (97-109) 58 Average  Fluid Reasoning 103 (95-111) 58 Average  Knowledge 111 (102-118) 77 High Average  Quantitative Reasoning 81 (75-91) 10 Low Average  Visual-Spatial Processing 103 (95-111) 58 Average  Working Memory 103 (95-111) 58 Average                                              Nonverbal Subtests Verbal Subtests  Fluid Reasoning 8 13  Knowledge 10 14  Quantitative Reasoning 6 7  Visual-Spatial Processing 10 11  Working Memory 13 8   CNS Vital Signs CNS Vital Signs is a computerized neuropsychological/neurocognitive test that assess a broad-spectrum of brain function domain performances under challenge (cognition stress test). Scores help to determine severity of impairment based on an age-matched normative comparison database. The standard scores have a mean of 100 and standard deviation of 15. Percentile Ranks indicates how the individual scored compared to other subjects of the same age. Subtests completed by individuals to create the domain scores include the Verbal and Visual Memory Tests, Finger Tapping, Symbol Digit Coding, the Stroop Test, Shifting Attention Test, Continuous Performance Tests, and sometimes the Four-Part Continuous Performance Tests. Scores on these various tests are used to  create the below domain scores. According to the Validity Indicator, most of Adelita's scores were valid. However, both the Sustained Attention and Working Memory domains were flagged as possibly invalid (likely due to her performance on the Four-Part Continuous Performance Test). As described above, she skipped ahead on the 2nd part of this test without reading the directions, and therefore, the second portion of the subtest was invalid. Since the Sustain Attention domain considers Daana's performance across 3 of 4 parts of the Four-Part Continuous Performance Test, this score was determined to be invalid and is not reported below. However, the Working Memory domain only considers Sandy's performance on the 4th part of the test, and therefore is reported below, but should be interpreted with caution. Of note, the following domains are  some of the most sensitive to attention deficit conditions: Processing Speed, [employer], Psychomotor Speed, Reaction Time, Complex Attention, and Cognitive Flexibility. Of note, Navey delayed taking her morning does of Vyvanse to complete this portion of the testing.   Domain Standard Score Percentile Range of Functioning  Composite Memory: how well a person can recognize, remember, and retrieve words and geometric figures. 88 21 Low Average  Verbal Memory: how well a person can recognize, remember, and retrieve words. 68 2 Very Low  Visual Memory: how well a person can recognize, remember and retrieve geometric figures. 107 68 Average  Psychomotor Speed: how a person perceives, attends, responds to visual-perceptual information, and performs motor speed and fine motor coordination. 95 37 Average  Reaction Time: how quickly a person can react to both simple and increasingly complex directions. 91 27 Average  Complex Attention: a person's ability to track and respond to a variety of stimuli and/or perform mental tasks requiring vigilance quickly and accurately. 107 68  Average  Cognitive Flexibility: how well a person can adapt to rapidly changing and increasingly complex set of directions and/or to manipulate information.  108 70 Average  Processing Speed: how well a person recognizes and processes information 108 70 Average  Executive Function: how well a person recognizes rules, categories, and manages or navigates rapid decision making. 110 75 Above Average  Working Memory: how well a person can perceive and attend to symbols using short-term memory processes. 91 27 Average  Simple Attention: a person's ability to track and respond to a single defined stimulus over lengthy periods of time while performing vigilance and response inhibition quickly and accurately.  108 70 Average  Motor Speed: a person's ability to perform movements to produce and satisfy an intention towards a manual action and goal. 88 21 Low Average   Overall, Talissa's lowest score was in the area of Verbal Memory. The Verbal Memory task asks individuals to remember a list of 15 words and then pick those words out of a list including the 15 to-be-remembered words as well as distractor words, both immediately and after a delay. On the initial memory task, although Janiha did not make errors (i.e., she did not select words that were not from the to-be-remembered list), she also failed to select many of the target words (with her score for correct hits falling at the 1%). 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 On the other hand, however, it is possible that Jahniyah was anxious or distracted during the memory test, which could have impacted her score (e.g., she could have been reluctant to select a word unless she was positive it was on the to-be-remembered list, which would likely have yielded a pattern of few errors combined with few correct responses, she could have been usure of the test, she could  have been distracted during the initial presentation of the words so did not have a chance to remember them etc.). Nonetheless, her memory for verbal information should be monitored over time and she may benefit from supports to address this weakness.   Adult ADHD Self-Report Scale (ASRS - v1.1) Symptom Checklist The ASRS is an 18-question questionnaire designed to help screen for AD/HD in adults. Of the eighteen questions, six have been found to be most predictive of symptoms consistent with AD/HD, with endorsement of at least 4 of these six items suggesting that the individual has a symptom profile  that is highly consistent with AD/HD in adults. In these cases, further investigation is warranted. It is noted that Charice's response on this questionnaire is in reference to her behavior on medication, as she reported that she has been on medication for so long that she does not know what her behavior off medication is like. 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 Pavithra endorsed concerns with her abilities to wrap up the final details of a project, organize, and remember appointments or obligations. Concerns noted in second part of questionnaire included difficulty keeping her attention on boring or repetitive work, being distracted by activity or noises, completing sentences for others before they can complete them themselves, and interrupting others when they are busy.   Conners' Adult ADHD Rating Scale (CAARS) -Short Form Doristine completed the Conners' Adult ADHD Rating Scales (CAARS)- Short Form. This measure provides information regarding symptoms of ADHD. Scores are T-scores with a mean of 50 and a standard deviation of 10. T-scores between 45-55 are considered average, while T-scores above 70 are considered very much above average. It is noted that Kenniyah's response on this questionnaire is in reference to her  behavior on medication, as she reported that she has been on medication for so long that she does not know what her behavior off medication is like.  Scale T-Score  Inattention/Memory Problems: degree to which the individual learns slowly, has difficulty organizing or completing tasks, and has trouble concentrating 47  Hyperactive/Restlessness: degree of which the person has difficulty working at the same task, feels more restless and is more "on the go" than others 44  Impulsiveness/Emotional Lability: degree to which the individual engages in impulsive acts, his/her mood changes quickly, and/or is more easily angered and irritated by people 33  Problems with Self-Concept: degree to which a person shows poor social relationships, low self-esteem, and/or low self-confidence 50  ADHD Index: level of clinically significant ADHD symptoms 45   The Behavior Rating Inventory of Executive Function-Adult Version (BRIEF-A) The Behavior Rating Inventory of Executive Function-Adult Version (BRIEF-A) is a standardized rating scale was designed to assess everyday behaviors that are indicators of an individual's executive functions. Much of the below information is from the BRIEF-2 scoring program. T scores (M = 50, SD = 10) are used to interpret the individual's level of executive functioning on the BRIEF-A. Usually, T scores at or above 65 are considered clinically significant.  The Self-Report Form of the BRIEF-A was completed by Erika Orr. The validity scales indicated that her profile was likely valid. The Behavioral Regulation Index [facility]) captures the ability to maintain appropriate regulatory control of one's own behavior and emotional responses. The Metacognition Index (MI) reflects the individual's ability to initiate activity and generate problem-solving ideas, to sustain working memory, to plan and organize problem-solving approaches, to monitor success and failure in problem solving, and to organize one's  materials and environment. On Izabel's profile, both the Behavioral Regulation Index (T = 52, %ile = 57) and the Metacognition Index (T = 59, %ile = 78) were within the average range. This suggests that Kelsa views her fundamental self-regulatory abilities and metacognitive executive functions as within expectation relative to her peers. Further, none of the individual self-report BRIEF-A scales were elevated, suggesting that Barbarella views herself as having appropriate ability to self-regulate, including the ability to inhibit impulsive responses, adjust to changes in routine or task demands, modulate emotions, monitor social  behavior, initiate problem solving or activity, sustain working memory, plan and organize problem-solving approaches, attend to task-oriented output, and organize environment and materials.         BRIEF Self-Report  Scale/Index Raw score T score Percentile 90% CI  Inhibit 13 53 63 45 - 61  Shift 11 60 82 52 - 68  Emotional Control 14 47 45 42 - 52  Self-Monitor 9 50 56 42 - 58  Behavioral Regulation Index (BRI) 47 52 57 48 - 56  Initiate 15 60 80 52 - 68  Working Memory 13 56 76 49 - 63  Plan/Organize 19 62 86 55 - 69  Task Monitor 12 63 95 54 - 72  Organization of Materials 13 50 57 44 - 56  Metacognition Index (MI) 72 59 78 55 - 63  Global Executive Composite (GEC) 119 57 74 54 - 76   Jayanna's father Contractor) completed the Informant-Report version of the BRIEF-A. The Informant Report Form provides information about an individual's functioning in the everyday environment based on an informant's observations. The validity scales indicated that there were no missing item responses, items were completed in a reasonable fashion (i.e., it appeared that the respondent did not respond to items in a haphazard or extreme manner), and responses were reasonably consistent. However, the Negativity scale was elevated, suggesting either that the respondent's view of Sumaya could be  excessively negative or that Tyshonda is exhibiting significant executive dysfunction. As such, the scores on the BRIEF should be interpreted with some caution.   On the BRIEF Informant-Report both the Behavioral Regulation Index (T = 70, %ile = 95) and Metacognition Index (T = 83, %ile = >99) were elevated. This suggests that her father views Reylene as having global difficulties with self-regulation, including the fundamental ability to inhibit impulses, modulate emotions, flexibly problem solve, and/or monitor the impact of her behavior on others. These global difficulties extend to metacognitive functions, including the ability to initiate activity, sustain working memory, plan and organize, and/or monitor success on tasks. Among the subscales, concerns were noted with Sharena's ability to adjust to changes in routine or task demands, modulate emotions, monitor social behavior, initiate problem solving or activity, sustain working memory, plan and organize problem-solving approaches, attend to task-oriented output, and organize environment and materials. Onita's ability to inhibit impulsive responses was not described as problematic. Her scores on the Shift and Emotional Control scales are elevated compared to age-matched peers. This profile suggests significant problem-solving rigidity combined with emotional dysregulation. Individuals with this profile tend to lose emotional control when their routines or perspectives are challenged and/or flexibility is required.   BRIEF Informant-Report Scale/Index Raw score T score Percentile 90% CI  Inhibit 15 60 79 53 - 67  Shift 18 84 >99 76 - 92  Emotional Control 24 65 90 61 - 69  Self-Monitor 15 68 96 61 - 75  Behavioral Regulation Index (BRI) 72 70 95 66 - 74  Initiate 24 83 >99 76 - 90  Working Memory 22 83 >99 77 - 89  Plan/Organize 29 81 >99 75 - 87  Task Monitor 16 77 98 71 - 83  Organization of Materials 24 75 >99 69 - 81  Metacognition Index (MI) 115  83 >99 80 - 86  Global Executive Composite (GEC) 187 79 >99 76 - 82   Symptom Checklist  Lyrick's parents both completed an AD/HD symptom checklist to rate the frequency that [facility] displayed 18 symptoms of AD/HD (9 inattentive and 9  hyperactive-impulsive) from two time periods; from ages 2-12 and for the past 6 months. Modest's parents both rated 6 of the 9 inattentive symptoms and 0 of the 9 hyperactive-impulsive behaviors as occurring "often" or "very often" from ages 73-12. In addition, 6 inattentive behaviors and 0 hyperactive-impulsive behaviors were rated as occurring "often" or "very often" in the last 6 months. Notably, her parents rated Gissela as showing somewhat more hyperactive-impulsive behaviors over time (specifically, between the ages of 25 and 89 her parents each rated two of the hyperactive impulsive symptoms as occurring "sometimes," while they rated 4-5 hyperactive-impulsive behaviors as occurring "sometimes" for the last 6 months).   Generalized Anxiety Disorder score (GAD-7) The GAD-7 is a self-report questionnaire used to assess symptoms associated with stress and anxiety. Scores range from no/minimal symptoms of anxiety to severe symptoms of anxiety. Kendelle's score on this measure, which was completed prior to the intake, fell in the "moderate anxiety" category.   Patient Health Questionnaire (PHQ-9) The PHQ-9 is a self-report questionnaire used for screening, diagnosing, and monitoring for symptoms of depression. Scores range from minimal to no symptoms of depression to severe symptoms of depression.  On this questionnaire, which was completed prior to the intake, Shawntrice's score fell in the "moderate depression" range.   The DSM-5-TR Level 1 Cross-Cutting Symptom Measure The DSM-5-TR Level 1 Cross-Cutting Symptom Measure is a measure that assesses mental health domains that are important across psychiatric diagnoses. The adult version of the measure consists of 23 questions that  provides brief screening across 13 psychiatric domains (depression, anger, mania, anxiety, somatic symptoms, suicidal ideation, psychosis, sleep problems, memory, repetitive thoughts and behaviors, dissociation, personality functioning, and substance use). Each item asks individuals to rate how much (or how often) they have been bothered by the specific symptom during the past 2 weeks. Each item on the measure is rated on a 5-point scale (0=none or not at all; 1=slight or rare, less than a day or two; 2=mild or several days; 3=moderate or more than half the days; and 4=severe or nearly every day), and domain where at least one item is rated at least mild (i.e., 2) can suggest areas of concern. For substance use, suicidal ideation, and psychosis, a rating of slight (i.e., 1) suggests that additional inquiry and follow-up may be beneficial. This measure, as well as the associated follow-up measures, were completed during the evaluation visit. On this measure, Angelina's scores suggested potential concerns in the areas of depression, anxiety, and sleep.   DSM-5 Depression Adult Measure The DSM-5-TR Level 2--Depression--Adult measure is an 8-item version of the PROMIS Depression Short Form that assesses the pure domain of depression in adults. Scores are converted to T-scores which fall into categories ranging from "none to slight" to "severe". Lecretia's score was within the "none to slight" category.   DSM-5 Anxiety Adult Measure The Anxiety Measure assess anxiety in individuals 87 or older.  Each item is rated on a 5-point scale with higher overall scores indicating a greater severity of anxiety. Loriann's scores suggest fell within the "none to slight" range.   DSM-5 Sleep Disturbance Adult Measure The Sleep Disturbance Short Form assess sleep disturbance in individuals 18 years and older. Each item is rated on a 5-point scale with higher overall scores indicating a greater severity of sleep disturbance. Mouna's  scores fell in the "none to slight" range.    Severity Measure for Social Anxiety Disorder (Social Phobia)--Adult The Severity Measure for Social Anxiety Disorder (Social Phobia)--Adult is a 10-item measure  that assesses the severity of symptoms of social anxiety (social phobia) in adults. Each item on the measure is rated on a 5-point scale (0=Never; 1=Occasionally; 2=Half of the time; 3=Most of the time; and 4=All of the time). It is meant to be completed after someone has received a diagnosis of social anxiety disorder but can also be relevant if they have clinically significant social anxiety disorder symptoms. A total score is calculated, with higher scores indicating greater severity of social anxiety. On this measure Jeanie's scores indicated a mild severity of social anxiety.   Severity Measure for Generalized Anxiety Disorder--Adult The Severity Measure for Generalized Anxiety Disorder--Adult is a 10-item measure that assesses the severity of generalized anxiety disorder in adults. It is meant to be completed after someone has received a diagnosis of generalized anxiety disorder but can also be relevant if they have clinically significant generalized anxiety disorder symptoms. A total score is calculated, with higher scores indicating greater severity of generalized anxiety disorder. On this measure Cristi's scores indicated a mild severity of generalized anxiety.  Personality Assessment Inventory (PAI) Kashauna completed the Personality Assessment Inventory, self-administered test of personality. The PAI is a multi-scale inventory designed for the clinical assessment of adults ages 43 and older. It includes 4 validity scales, 11 clinical scales, 5 treatment scales, and 2 interpersonal scales. Scores on the PAI are linear T-scores with a mean of 50 and standard deviation of 10.  On the PAI, scores on the validity scales indicated that Keaisha's profile was likely valid. Although she did not show  clinical elevations on the domain score profile, there were a few areas of concern among the subscales. Among the Anxiety subscales, for example, Anijah's scores were moderately elevated in the area of cognitive. This suggests that she may have a tendency to have ruminative worries and be somewhat vigilant to potential danger. Further, among the Anxiety Related Disorders subscales Kurstyn showed moderately elevated scores in the area of traumatic stress. This suggests that Dianelis has had a past experience of a traumatic event(s) that continues to be a source of distress and produce anxiety. Among the Depression subscales Dashonna showed mild elevations in the area of cognitive suggesting that she may have some mild feelings of inadequacy or helplessness in dealing with the demands of her environment, and/or feels that she has not been successful in important life tasks. Among the Interpersonal Skills subscales, Danett's score on the dominance scale suggests that she may be slightly self-conscious in social interactions or may struggle to assert herself when needed. Her score in the area of warmth indicated that she is a warm friendly and sympathetic individual who likely values harmonious relationships and therefore may be slightly uncomfortable with confrontation or conflict.  Diagnostic Criteria for Attention Deficit/Hyperactivity Disorder from the DSM-5  The Diagnostic and Statistical Manual of Mental Disorders, Fifth Edition (DSM-5) is the handbook currently used by health care professionals in the [place] and much of the world as a guide to diagnosis. The DSM-5 contains descriptions, symptoms, and other criteria for diagnosis. Below are some of the DSM-5 criteria for Attention Deficit/Hyperactivity Disorder (ADHD). Presence of sufficient evidence is determined according to clinical judgment, which is informed by interviews and/or assessment with the individual and other people who are familiar with Mollye's  behavior. This section describes persistent patterns of inattention and/or hyperactivity-impulsivity. To meet criteria for a diagnosis of ADHD the individual must meet criteria in the area of inattention (A1), or hyperactivity-impulsivity (A2), or both. In Tiffany's case,  because she already has a diagnosis of AD/HD the below considers if there is evidence of ongoing concerns. Possible responses include No, Minimal, Some, and Yes    Evidence?   A1. Significant symptoms of inattention? Yes  From Interview with Sullivan Lone reported that she can have difficulty paying attention to details or make careless mistakes. For example, when she finds a task boring, she wants to get it over with and does not put as much attention and focus into the activity. 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 Jaiyana reported that she can also become distracted easily.    Analys described herself as someone that is not "super organized" and noted that her room is always a bit messy. She also reported having some difficulty with organization of projects (i.e., she has a history of "diving into" projects and then realizing that she should have planned things out a bit more before starting). Further, Alegna is often late, despite wanting to be on time and being aware of the time that she is expected somewhere. She has had difficulty determining what causes her to be late, indicated that she was unsure if she is spending too much time on tasks, losing track of time, underestimating how much time things will take, or is late for some other reason. 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     Tyreshia sometimes does not follow through with instructions or complete  tasks, even when tasks are fun. For example, she may start an art project but loses interest when she must wait for a portion to dry to move forward, and then ends up not completing the project. She reported that she can sometimes "get in the zone" or hyperfocus, but if she is interrupted or must stop the activity before it is complete, she may not be able to pick back up where she left off. Thus, she can be annoyed when she is interrupted because she worries that when she comes back to the project it "will not be the same".    Camarie also sometimes avoids tasks that require mental effort. For example, she indicated that during the evaluation if she looked at an item and felt it was going to be challenging, she may have guessed rather than trying to figure it out so she could be done with it. Jestiny also noted that when she struggled with items, she began feeling stupid and then wanted to move on. In childhood, she had difficulty with studying. For example, in childhood she was often able to absorb quite a bit from her classes and "glide by" without studying, so would not study at home or would put off studying. Further, even when she felt that studying would be helpful for her, she could not make herself do it. There were times when she would try to make study aides (e.g., making flashcards for Spanish class) but would not end up using them.      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 For example, during school even when teachers explicitly said that a project could not be completed in one night, Lashelle would wait until the night before the project was due to start it.     Santiana also reported  that when something is not "immediately rewarding" it often goes to the back of her head. For example, she may realize that going to bed will help her to feel better the next day but will instead choose to stay up talking to someone because she is having fun in the moment. She reported that she often goes for the short-term  reward even if she knows it may not be beneficial for her in the long-term.    Positively, however, Fumiye reported that although her brain "always wanders a little" she can generally focus on necessary tasks. She reported that she "loves connecting to people" so she can usually marshal her resources to pay attention when people are talking to her because she feels that it is important. However, there are sometimes when she is enjoying or excited about talking to someone and will begin thinking about a variety of other topics and connections that she can bring up, which can reduce her listening. Occasionally the thoughts that come up for her are related to anxiety, but usually only when she is talking to someone that she does not know very well and is unsure what they think of her.    Further, Dalaysia does not misplace her belongings. When she is interested in something July can easily remember it (e.g., she can remember quite a bit of information about Luther Parody).     Information from Other Reporters  According to paperwork from [facility] mother, Garland has a history of showing several inattentive behaviors, including difficulty with organizing and planning, maintaining a calendar, getting places on time, and starting tasks. When working on a task, she can be easily distracted by environmental stimuli and then have difficulty getting back on track.     A2. Significant symptoms of hyperactivity-impulsivity? Some  From Interview with Sullivan Lone reported that she can sometimes be fidgety and may bounce or shake her leg at times. She also can sometimes talk excessively. She described that this has increased because she is so isolated and has fewer people to talk to, so when she is able to talk to someone she might talk more. She sometimes blurts out answers. In addition, although she tries not to interrupt others, there are times when someone is completing a sentence and she jumps in. She cares about what the  person is saying but feels that her tendency to interrupt may come across to others that she does not care about what they are saying.     On the other hand, she can stay seated when expected, does not feel restless, has no difficulty engaging in quiet activities, and is not always on the go. She does not have difficulty waiting her turn.    Summary and Diagnostic Impressions:  Lexy was seen for an evaluation to obtain updated information about her attention/focus, mood and anxiety. She was reportedly diagnosed with AD/HD in high school, and was subsequently prescribed Vyvanse, which she has taken ever since. Additional relevant background information includes that post birth Jolynne was treated with antibiotics related to an infection that her mother had at delivery and developed jaundice (though did not require treatment for this). Although she reached her early milestones as expected, her mother reported that when she was around 27 months old, Kylen was frequently ill and showed decreased walking and talking. After several months her heath seemed to improve, and her skills seemed to return to normal. Erika Orr contracted Lyme disease in the summer before starting high school. During her freshman year  of high school, she experienced several health challenges, as well as significant anxiety and depression. Nessa had another bout of significant illness during college.   During the current evaluation, Katiann completed cognitive testing. Specifically, the Stanford-Binet 5 was used to assess Seham's cognitive functioning in a range of domains. It is important to note that cognitive scores can be influenced by a variety of factors. Therefore, scores are best interpreted as a snapshots of the individual's current cognitive functioning. Sayda's FSIQ, Verbal IQ, and Nonverbal IQ scores were all in the average range, though these scores can be interpreted with some caution given the variability of the scores included in  each domain. Domain scores ranged from low average (Quantitative Reasoning) to high average (Knowledge). On a computerized neurocognitive battery, Jericka's Verbal Memory score was within the very low range. As such, Marlo may benefit for supports in her areas of personal weakness.   As noted above, Bayleigh reported a history of both depression and anxiety. It is important to note that Najaya described that significant mood and anxiety concerns developed after she contracted Lyme disease. Since her mood challenges began, Shanygne has periodically experienced elevated symptoms of depression. During the current evaluation, she seemed to endorse more symptoms of depression prior to the intake than she did during the evaluation visits. Nonetheless, during the evaluation Lianette reported some ongoing negative feelings associated with not having a job as well as significant worries that her depression will escalate again. 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 Her current presentation suggested some ongoing concerns in these areas, though her overall level of symptoms may be lower than it had been in the past. It is important to note, however, that it is possible that Nelson's symptom reduction is at least partially accounted for by her currently life circumstances, which may lower the chances of her being in situations that could trigger anxiety. For example, she has limited social opportunities, some of which may be related to avoidance of social situations (e.g., despite looking up clubs/groups she could join she has been too anxious to try to attend them). As such, it will be important to continue to work with Erika Orr to address anxiety and develop a  plan to help expand her social engagement and community involvement, as well as to monitor Carlyann's symptoms as this occurs.   The current evaluation also considered Carman's current AD/HD presentation. Overall, Bella's AD/HD picture is complicated. For example, as noted above Mazella was diagnosed with AD/HD and has been treated with Vyvanse since high school. However, the timeline surrounding Diann's AD/HD treatment (including reportedly contracting Lyme disease and developing anxiety and depression prior to being treated for AD/HD) potentially complicates interpretation of her AD/HD history. On the other hand, during the current evaluation her parents endorsed significant inattentive symptoms from the ages of 5-12 years, suggesting significant symptoms prior to these other challenges occurring and making it less likely that Meggin's AD/HD symptoms would have been full accounted for by something else. In addition, understanding of her current presentation was also complicated by several factors including her medication regimen, current life circumstances, and differences across reporters. For example, Kaaliyah reported that she has been taking medication with good adherence since it was prescribed more than 10 years ago and could therefore  not provide ratings of her behaviors off medication. Although she was able to identify ways that her symptoms of AD/HD impacted her functioning previously (e.g., she noted that when she was working, she was tardy enough that her employer talked to her about this), since she is not currently in school or working, recent exposure to environments with the potential to strain her attention and executive functioning skills have been somewhat limited. In this context, during the current evaluation, self-report ratings of her behavior on medication tended to be subthreshold or within normal limits. There was also, however, a discrepancy between Shaia's report of her own behavior and her  parent's report of her behaviors. For example, although scores need to be interpreted with a bit of caution, her father's scores on the BRIEF were significantly elevated across a range of areas, while Terrill's self-report BRIEF scores generally indicated limited concerns. Hokulani's parents also reported significant ongoing symptoms of inattention, which contrasts with Jamilya's report of her own current symptoms. This discrepancy combined with some behavioral observations made during the evaluation raises the possibility that Latica may have reduced insight about her symptoms. Taken together, both Meleia and her mother reported a previous diagnosis of AD/HD with her mother reporting sufficient evidence of symptoms in childhood. Although there is some uncertainty about her current presentation, information from Igo and her parents as well as behavioral observations indicate that there are also at least some ongoing challenges with attention and executive functioning skills. 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 Symptoms of AD/HD (both self and other-reported) should continue to be carefully monitored, especially as Deah expands her community involvement and/or returns to a working environment, which should help to clarify her current symptom presentation. Provided that she continues to show behaviors consistent with AD/HD, Danessa should continue to receive treatment targeting her executive functioning skills as well as her symptoms of inattention. On the other hand, if Lataisha is engaged in environments that burden her executive functioning skills and does not have difficulty or show symptoms of inattention, further reconsideration of her AD/HD diagnosis may be warranted to ensure that this diagnosis continues to be a part of Dayleen's overall diagnostic profile.   Overall, Gaye would benefit from additional  supports and interventions targeting her skills in several areas. As such, recommendations for intervention services and ongoing supports were provided. A detailed review of recommendations is listed below.  Recommendations:  The following recommendations are based on findings from the present evaluation and include a variety of recommendations including some from various scoring programs.  If certain services are already being obtained based on a previous recommendation, please continue with those services:  It is recommended that results of this evaluation be shared with Tallyn's primary care provider. It may also be helpful to discuss with her doctor if Lyme disease and/or other illness that she has experienced could have contributed to any current challenges.   Continue in therapy targeting anxiety and depression.   Treatment of ADHD in adults often includes medical and psychological/behavioral intervention.  For example, it would be helpful for Elanah to continue working with her therapist to strengthen her executive functioning skills, increase her emotional and behavioral regulation, and address any ongoing anxiety and mood concerns. Some potential therapeutic approaches to consider include Dialectical Behavioral Therapy (DBT) and Cognitive Behavioral Therapy (CBT). Additionally, it is recommended that Genesa continue to work with her prescriber for medication management. As  she increases her community engagement and/or returns to work, her AD/HD symptoms should continue to be monitored. Notably, if considering medication changes it will be important to obtain information not just from [place] but from those around her as well, as the current evaluation suggests that there are differences between how Parma rates her own symptoms and the behaviors that are observed by those around her. For example, if after returning to work Charisse does not show challenges with attention or executive functioning skills  and does not require any compensatory supports, she may want to discuss the possibility of decreasing or taking a break from medication targeting symptoms of AD/HD to fully determine if these medications are still necessary. Alternatively, it is possible that were Aubren to increase her community participation and/or return to work she could experience an increase in challenges, which she should also talk to her prescriber about.  Given some of the results of the PAI questionnaire she may benefit from some trauma work in the context of her ongoing mental health therapy.  Given anxiety, including a history of social anxiety, it is recommended that Kamora develop a plan (with help from her therapist) to increase participation in the communicate and increase social engagement. This plan may need to begin with small easy to achieve steps and gradually build to trying some of the social activities that she is interested in but at this time unable to engage with due to anxiety. For example, she could develop a hierarchy or stepwise plan, with potential steps such as research potential social opportunities, then call or email to get more information, then go to observe for 10 minutes, then go to observe for 20 minutes, then go and introduce self to one person, etc.    Given results of the Verbal Memory test, Shiya will require visual backups for verbally presented information.   A number of strategies addressing executive functioning skills may be helpful for Kiare (of note many of the below recommendations are from the BRIEF materials).  Visual organizers such as pictures, schedules, planners, timers, and calendar boards may be helpful.  An individual who has difficulties shifting attention often needs to focus on only one task at a time. Presenting one task at a time and limiting choices to only one or two may be helpful. Shary might benefit from practicing her mental flexibility. Working with two or three  familiar tasks and rotating them at regular intervals may help develop greater flexibility and help her become more accustomed to shifting. Rayshawna might benefit from opportunities to discuss upcoming situations or events that may provoke strong emotions. Increasing her awareness of the potential for emotional reactivity and the likely consequences to follow may help her modulate more effectively in the moment. Reinforcing Jamiya's ability to identify stress-inducing situations ahead of time, her use of relaxation methods, or her implementation of more modulated forms of emotional expression (e.g., verbalizing feelings associated with a stress response or verbalizing the impact of the stressor) may be helpful. Adults with initiation difficulties have trouble "getting going" or starting tasks. This can be exhibited in a number of ways: (a) behaviorally, such that they cannot get started on physical activities like getting up; (b) socially, such that they have difficulty calling friends or going out to be with friends; (c) academically or at work, such that they have trouble getting started on homework or assignments; or (d) cognitively, such that they have difficulty coming up with ideas or generating plans. Basic tenets of intervention include providing  additional external structure, prompting and cuing, and helping with organization and planning. Increased structure in the environment or in an activity can help with initiation difficulties. Building in routines for everyday activities is often important. As routine tasks and their completion become more automatic, the need for independent initiation will be reduced.  Many individuals with initiation difficulties are viewed as unmotivated or "lazy". It is important to reframe this problem for those who work with Erika Orr as one of initiation and not willful behavior. Problems with initiating may be exacerbated by Antonae's sense of being overwhelmed by a given task.  Tasks or assignments that seem too large can interfere with her ability to get started. Breaking tasks into smaller, more structured steps may reduce her sense of being overwhelmed and increase initiation. Physical activity, group interaction, frequent short breaks with motor activity, and variation of pace or stimulation may be explored as means of increasing arousal and supporting initiation. It is often helpful to provide examples or work samples that serve as a model of what is expected. Aleza can then follow the example to help cue what is next. Provide appropriate supportive signals or cues that remind her to initiate an activity (e.g., cues by devices such as alarm watch, Materials engineer). Use natural cues whenever possible, including peers when appropriate. It is important to appreciate that different tasks place varying demands on Bethan's ability to initiate. Tasks that are inherently motivating often require less internal initiation than tasks that are less motivating. Similarly, more complex tasks may require greater initiation, and thus, more external prompting.  The rate of presentation for new material may need to be altered. Garnet may need additional processing time or time to rehearse novel information. Briona may also benefit from having tasks or information broken down into smaller steps or chunks. This will help limit the demands placed on her working memory at any given time. Changing from one task to the next sooner can help restore her focus for a brief period of time. Lengthy tasks, particularly those that Montgomery experiences as tedious or monotonous, should be avoided or interspersed with more frequent breaks or other, more engaging tasks. Jontue might be rewarded with a more stimulating activity for completing the more tedious task. It will be important to reduce distractions in the environment. Adults with working memory difficulties often benefit from multimodal presentation  of information. Verbal instruction can be accompanied by visual cues, demonstration, and guidance to increase the likelihood that new material will be learned. Providing a written checklist of steps required to complete a task can serve as an external memory support and alleviate some of the burden on working memory.  Have Kavia verbalize a plan of approach at the outset for any given task. Then provide feedback and assistance to develop the plan in sufficient detail and to increase the likelihood of success. In particular, help her break the plan down into a series of critical steps, arranged in sequential order, and written down as a bullet list.  Ronnae might be asked to develop more than one plan for a task or activity in order to increase her awareness of alternative approaches. Strategic planning can be practiced with familiar, everyday tasks. Delaynie might develop a plan for completing familiar routines in a more efficient manner and then carry out the plan. Her level of motivation may be enhanced by initially focusing on the development and completion of familiar plans.  Teach Genevie to develop timelines for completing tasks, particularly for long-term tasks  such as projects. Nelson may need assistance in budgeting her time to complete each step or phase in larger projects or tasks. Break long-term tasks into sequential steps, with timelines for completion of each step and check-ins with caregivers/supervisors to ensure that she is keeping pace with expectations. Najae may feel overwhelmed by lengthy and/or complex tasks that require multiple steps to complete. She may benefit from instruction in how to break complex tasks into their simpler component parts to facilitate development of an organized and well-planned approach. Develop a timeline with her for completion of each part and encourage her to review what has been accomplished after completion of each part. Ewaoluwa may benefit from the use of computer  software or a handheld electronic device to input and organize tasks that need to be accomplished. These could be used to facilitate review of tasks carried out during the day and to plan and organize the next day's activities. Kitiara may have difficulty monitoring her behavior and recognizing when she makes errors. It may be helpful to build in editing or reviewing as an integral part of every task to increase the likelihood of error recognition and correction.  Additional executive functioning recommendations that may be helpful for work include: Zaia should ask for regular feedback from her supervisors because she may not always have full awareness of her performance. Regular feedback can help her ensure that she is meeting expectations and completing necessary tasks in a timely manner. Sivi would benefit from being provided with feasible deadlines for necessary activities. She would also potentially benefit from supervisors or those around her helping her to prioritize necessary tasks.  A book that may be helpful for Theron is Smart but Scattered Guide to Success: How to Use Your Brain's Executive Skills to Keep Up, [employer], and Get Organized at Work and at Home by Bland.  A book that may be helpful for Shakinah's family is: Proofreader but Scattered --and Stalled: 10 Steps to Help Young Adults Use Their Executive Skills to [employer], Make a Plan, and Successfully Leave the Nest by Ethelene Browns, [place].  Kehlani may also benefit from working with an executive functioning skill coach that can help address any areas of weakness and help set them up for success. For example, [employer] (www.chadd.org) maintains a [employer] that includes a number of AD/HD coaches.   Benjie may want to consider an Auditory Processing Evaluation if she has never had one.   Adults with learning and/or developmental disabilities may benefit from learning more about work accommodations and  about how to talk to employers about accommodations. More information about workplace accommodations and the Americans with Disabilities Act can be found at the [employer] (DeathPrevention.it). They can also be contacted by phone at [phone]. Their website also has a form letter for requesting workplace accommodations, and can be found at SouthExposed.es.cfm as well as a Comptroller of workplace accommodations.  Specific information regarding executive functioning accommodations, for example, can be found at: DomainerFinder.dk.cfm  Additional information regarding executive functioning challenges and accommodations can be found at: http://www.washington-warren.com/.cfm   Several websites may also be helpful when learning about ADHD. One helpful resource is: [employer]: Children and Adults with Attention-Deficit/Hyperactivity Disorder (www.chadd.org)  For example, the [place] website has a section regarding common workplaces challenges for individuals with AD/HD and strategies for success, which can be found at: https://chadd.org/for-adults/workplace-issues/   Thank you for the opportunity to be involved in Emalia's care.  If you have further questions regarding the results of this assessment, please contact me at [phone].     Zara Chess, PhD  __________________________________, PhD Zara Chess, PhD Psychology License # [id number], Health Services Provider Certification: HSP-P               Zara Chess, PhD

## 2022-11-12 ENCOUNTER — Other Ambulatory Visit (HOSPITAL_COMMUNITY): Payer: Self-pay

## 2022-11-12 MED ORDER — MOUNJARO 5 MG/0.5ML ~~LOC~~ SOAJ
5.0000 mg | SUBCUTANEOUS | 1 refills | Status: AC
  Filled 2022-11-12 – 2022-11-13 (×2): qty 2, 28d supply, fill #0
  Filled 2022-12-28 (×3): qty 2, 28d supply, fill #1

## 2022-11-13 ENCOUNTER — Other Ambulatory Visit (HOSPITAL_COMMUNITY): Payer: Self-pay

## 2022-11-16 ENCOUNTER — Other Ambulatory Visit (HOSPITAL_COMMUNITY): Payer: Self-pay

## 2022-11-17 ENCOUNTER — Other Ambulatory Visit (HOSPITAL_COMMUNITY): Payer: Self-pay

## 2022-11-23 ENCOUNTER — Other Ambulatory Visit (HOSPITAL_BASED_OUTPATIENT_CLINIC_OR_DEPARTMENT_OTHER): Payer: Self-pay

## 2022-11-23 MED ORDER — LISDEXAMFETAMINE DIMESYLATE 50 MG PO CAPS
50.0000 mg | ORAL_CAPSULE | Freq: Every morning | ORAL | 0 refills | Status: AC
  Filled 2022-11-23: qty 90, 90d supply, fill #0
  Filled 2022-12-15: qty 60, 60d supply, fill #0

## 2022-11-24 ENCOUNTER — Other Ambulatory Visit: Payer: Self-pay

## 2022-12-03 ENCOUNTER — Encounter (HOSPITAL_BASED_OUTPATIENT_CLINIC_OR_DEPARTMENT_OTHER): Payer: Self-pay

## 2022-12-03 ENCOUNTER — Other Ambulatory Visit (HOSPITAL_BASED_OUTPATIENT_CLINIC_OR_DEPARTMENT_OTHER): Payer: Self-pay

## 2022-12-08 ENCOUNTER — Other Ambulatory Visit (HOSPITAL_BASED_OUTPATIENT_CLINIC_OR_DEPARTMENT_OTHER): Payer: Self-pay

## 2022-12-08 ENCOUNTER — Other Ambulatory Visit: Payer: Self-pay

## 2022-12-15 ENCOUNTER — Other Ambulatory Visit: Payer: Self-pay

## 2022-12-15 ENCOUNTER — Other Ambulatory Visit (HOSPITAL_BASED_OUTPATIENT_CLINIC_OR_DEPARTMENT_OTHER): Payer: Self-pay

## 2022-12-16 ENCOUNTER — Other Ambulatory Visit (HOSPITAL_BASED_OUTPATIENT_CLINIC_OR_DEPARTMENT_OTHER): Payer: Self-pay

## 2022-12-16 ENCOUNTER — Other Ambulatory Visit (HOSPITAL_COMMUNITY): Payer: Self-pay

## 2022-12-17 ENCOUNTER — Other Ambulatory Visit (HOSPITAL_BASED_OUTPATIENT_CLINIC_OR_DEPARTMENT_OTHER): Payer: Self-pay

## 2022-12-28 ENCOUNTER — Other Ambulatory Visit (HOSPITAL_BASED_OUTPATIENT_CLINIC_OR_DEPARTMENT_OTHER): Payer: Self-pay

## 2022-12-28 ENCOUNTER — Other Ambulatory Visit: Payer: Self-pay

## 2022-12-28 ENCOUNTER — Other Ambulatory Visit (HOSPITAL_COMMUNITY): Payer: Self-pay

## 2022-12-28 MED ORDER — MOUNJARO 7.5 MG/0.5ML ~~LOC~~ SOAJ
7.5000 mg | SUBCUTANEOUS | 0 refills | Status: AC
  Filled 2022-12-28 – 2023-03-01 (×2): qty 2, 28d supply, fill #0

## 2022-12-28 MED ORDER — ZEPBOUND 7.5 MG/0.5ML ~~LOC~~ SOAJ
7.5000 mg | SUBCUTANEOUS | 2 refills | Status: AC
  Filled 2022-12-28: qty 2, 28d supply, fill #0
  Filled 2023-03-01: qty 6, 84d supply, fill #0
  Filled 2023-03-25 – 2023-03-27 (×4): qty 2, 28d supply, fill #0
  Filled 2023-04-26: qty 2, 28d supply, fill #1
  Filled 2023-05-24: qty 2, 28d supply, fill #2
  Filled 2023-09-07: qty 2, 28d supply, fill #3
  Filled 2023-10-01: qty 2, 28d supply, fill #4
  Filled 2023-10-21 – 2023-10-22 (×2): qty 2, 28d supply, fill #5
  Filled 2023-10-25: qty 2, 28d supply, fill #6

## 2022-12-28 MED ORDER — ZEPBOUND 2.5 MG/0.5ML ~~LOC~~ SOAJ
2.5000 mg | SUBCUTANEOUS | 0 refills | Status: AC
  Filled 2022-12-28 – 2022-12-30 (×2): qty 2, 28d supply, fill #0

## 2022-12-28 MED ORDER — ZEPBOUND 5 MG/0.5ML ~~LOC~~ SOAJ
5.0000 mg | SUBCUTANEOUS | 0 refills | Status: AC
  Filled 2022-12-28 – 2023-02-05 (×3): qty 2, 28d supply, fill #0

## 2022-12-30 ENCOUNTER — Other Ambulatory Visit (HOSPITAL_BASED_OUTPATIENT_CLINIC_OR_DEPARTMENT_OTHER): Payer: Self-pay

## 2022-12-31 ENCOUNTER — Other Ambulatory Visit (HOSPITAL_BASED_OUTPATIENT_CLINIC_OR_DEPARTMENT_OTHER): Payer: Self-pay

## 2023-01-09 ENCOUNTER — Other Ambulatory Visit (HOSPITAL_COMMUNITY): Payer: Self-pay

## 2023-01-13 ENCOUNTER — Other Ambulatory Visit (HOSPITAL_BASED_OUTPATIENT_CLINIC_OR_DEPARTMENT_OTHER): Payer: Self-pay

## 2023-01-14 ENCOUNTER — Other Ambulatory Visit (HOSPITAL_BASED_OUTPATIENT_CLINIC_OR_DEPARTMENT_OTHER): Payer: Self-pay

## 2023-01-19 ENCOUNTER — Other Ambulatory Visit (HOSPITAL_COMMUNITY): Payer: Self-pay

## 2023-01-25 ENCOUNTER — Other Ambulatory Visit (HOSPITAL_COMMUNITY): Payer: Self-pay

## 2023-01-28 ENCOUNTER — Other Ambulatory Visit (HOSPITAL_COMMUNITY): Payer: Self-pay

## 2023-01-28 ENCOUNTER — Encounter (HOSPITAL_COMMUNITY): Payer: Self-pay

## 2023-02-05 ENCOUNTER — Other Ambulatory Visit (HOSPITAL_BASED_OUTPATIENT_CLINIC_OR_DEPARTMENT_OTHER): Payer: Self-pay

## 2023-02-08 ENCOUNTER — Other Ambulatory Visit (HOSPITAL_BASED_OUTPATIENT_CLINIC_OR_DEPARTMENT_OTHER): Payer: Self-pay

## 2023-02-23 ENCOUNTER — Other Ambulatory Visit (HOSPITAL_BASED_OUTPATIENT_CLINIC_OR_DEPARTMENT_OTHER): Payer: Self-pay

## 2023-02-23 MED ORDER — ZEPBOUND 7.5 MG/0.5ML ~~LOC~~ SOAJ
7.5000 mg | SUBCUTANEOUS | 2 refills | Status: DC
  Filled 2023-02-23 – 2023-03-01 (×2): qty 2, 28d supply, fill #0
  Filled 2023-06-28: qty 2, 28d supply, fill #1
  Filled 2023-07-26 (×3): qty 2, 28d supply, fill #2

## 2023-03-01 ENCOUNTER — Other Ambulatory Visit (HOSPITAL_BASED_OUTPATIENT_CLINIC_OR_DEPARTMENT_OTHER): Payer: Self-pay

## 2023-03-25 ENCOUNTER — Other Ambulatory Visit (HOSPITAL_BASED_OUTPATIENT_CLINIC_OR_DEPARTMENT_OTHER): Payer: Self-pay

## 2023-03-26 ENCOUNTER — Other Ambulatory Visit (HOSPITAL_BASED_OUTPATIENT_CLINIC_OR_DEPARTMENT_OTHER): Payer: Self-pay

## 2023-03-27 ENCOUNTER — Other Ambulatory Visit (HOSPITAL_BASED_OUTPATIENT_CLINIC_OR_DEPARTMENT_OTHER): Payer: Self-pay

## 2023-03-29 ENCOUNTER — Other Ambulatory Visit (HOSPITAL_BASED_OUTPATIENT_CLINIC_OR_DEPARTMENT_OTHER): Payer: Self-pay

## 2023-03-30 ENCOUNTER — Other Ambulatory Visit (HOSPITAL_BASED_OUTPATIENT_CLINIC_OR_DEPARTMENT_OTHER): Payer: Self-pay

## 2023-04-26 ENCOUNTER — Other Ambulatory Visit (HOSPITAL_BASED_OUTPATIENT_CLINIC_OR_DEPARTMENT_OTHER): Payer: Self-pay

## 2023-05-07 ENCOUNTER — Other Ambulatory Visit (HOSPITAL_BASED_OUTPATIENT_CLINIC_OR_DEPARTMENT_OTHER): Payer: Self-pay

## 2023-05-07 MED ORDER — LISDEXAMFETAMINE DIMESYLATE 50 MG PO CAPS
50.0000 mg | ORAL_CAPSULE | Freq: Every morning | ORAL | 0 refills | Status: DC
  Filled 2023-05-15: qty 90, 90d supply, fill #0

## 2023-05-15 ENCOUNTER — Other Ambulatory Visit (HOSPITAL_BASED_OUTPATIENT_CLINIC_OR_DEPARTMENT_OTHER): Payer: Self-pay

## 2023-05-17 ENCOUNTER — Other Ambulatory Visit: Payer: Self-pay

## 2023-05-17 ENCOUNTER — Other Ambulatory Visit (HOSPITAL_BASED_OUTPATIENT_CLINIC_OR_DEPARTMENT_OTHER): Payer: Self-pay

## 2023-05-24 ENCOUNTER — Other Ambulatory Visit (HOSPITAL_BASED_OUTPATIENT_CLINIC_OR_DEPARTMENT_OTHER): Payer: Self-pay

## 2023-06-28 ENCOUNTER — Other Ambulatory Visit (HOSPITAL_BASED_OUTPATIENT_CLINIC_OR_DEPARTMENT_OTHER): Payer: Self-pay

## 2023-06-29 ENCOUNTER — Other Ambulatory Visit (HOSPITAL_COMMUNITY): Payer: Self-pay

## 2023-07-26 ENCOUNTER — Other Ambulatory Visit (HOSPITAL_BASED_OUTPATIENT_CLINIC_OR_DEPARTMENT_OTHER): Payer: Self-pay

## 2023-08-05 ENCOUNTER — Other Ambulatory Visit (HOSPITAL_BASED_OUTPATIENT_CLINIC_OR_DEPARTMENT_OTHER): Payer: Self-pay

## 2023-08-05 MED ORDER — VENLAFAXINE HCL ER 150 MG PO CP24
150.0000 mg | ORAL_CAPSULE | Freq: Every morning | ORAL | 1 refills | Status: AC
  Filled 2023-08-05: qty 90, 90d supply, fill #0

## 2023-08-05 MED ORDER — LISDEXAMFETAMINE DIMESYLATE 50 MG PO CAPS
50.0000 mg | ORAL_CAPSULE | Freq: Every morning | ORAL | 0 refills | Status: DC
  Filled 2023-08-16: qty 90, 90d supply, fill #0

## 2023-08-16 ENCOUNTER — Other Ambulatory Visit (HOSPITAL_BASED_OUTPATIENT_CLINIC_OR_DEPARTMENT_OTHER): Payer: Self-pay

## 2023-09-07 ENCOUNTER — Other Ambulatory Visit (HOSPITAL_BASED_OUTPATIENT_CLINIC_OR_DEPARTMENT_OTHER): Payer: Self-pay

## 2023-10-01 ENCOUNTER — Other Ambulatory Visit (HOSPITAL_BASED_OUTPATIENT_CLINIC_OR_DEPARTMENT_OTHER): Payer: Self-pay

## 2023-10-13 ENCOUNTER — Other Ambulatory Visit (HOSPITAL_BASED_OUTPATIENT_CLINIC_OR_DEPARTMENT_OTHER): Payer: Self-pay

## 2023-10-13 MED ORDER — LISDEXAMFETAMINE DIMESYLATE 50 MG PO CAPS
50.0000 mg | ORAL_CAPSULE | Freq: Every morning | ORAL | 0 refills | Status: DC
  Filled 2023-10-13 – 2023-10-25 (×2): qty 90, 90d supply, fill #0

## 2023-10-21 ENCOUNTER — Other Ambulatory Visit (HOSPITAL_BASED_OUTPATIENT_CLINIC_OR_DEPARTMENT_OTHER): Payer: Self-pay

## 2023-10-22 ENCOUNTER — Other Ambulatory Visit (HOSPITAL_BASED_OUTPATIENT_CLINIC_OR_DEPARTMENT_OTHER): Payer: Self-pay

## 2023-10-25 ENCOUNTER — Other Ambulatory Visit: Payer: Self-pay

## 2023-10-25 ENCOUNTER — Other Ambulatory Visit (HOSPITAL_BASED_OUTPATIENT_CLINIC_OR_DEPARTMENT_OTHER): Payer: Self-pay

## 2024-01-15 ENCOUNTER — Other Ambulatory Visit (HOSPITAL_COMMUNITY): Payer: Self-pay

## 2024-01-15 ENCOUNTER — Other Ambulatory Visit (HOSPITAL_BASED_OUTPATIENT_CLINIC_OR_DEPARTMENT_OTHER): Payer: Self-pay

## 2024-01-18 ENCOUNTER — Other Ambulatory Visit (HOSPITAL_BASED_OUTPATIENT_CLINIC_OR_DEPARTMENT_OTHER): Payer: Self-pay

## 2024-01-18 ENCOUNTER — Other Ambulatory Visit: Payer: Self-pay

## 2024-01-18 ENCOUNTER — Other Ambulatory Visit (HOSPITAL_COMMUNITY): Payer: Self-pay

## 2024-01-18 MED ORDER — ZEPBOUND 10 MG/0.5ML ~~LOC~~ SOAJ
10.0000 mg | SUBCUTANEOUS | 2 refills | Status: DC
  Filled 2024-01-18: qty 2, 28d supply, fill #0

## 2024-01-18 MED ORDER — TIRZEPATIDE-WEIGHT MANAGEMENT 7.5 MG/0.5ML ~~LOC~~ SOAJ
7.5000 mg | SUBCUTANEOUS | 2 refills | Status: DC
  Filled 2024-01-18 (×3): qty 2, 28d supply, fill #0
  Filled 2024-02-10: qty 2, 28d supply, fill #1
  Filled 2024-03-06: qty 2, 28d supply, fill #2

## 2024-01-19 ENCOUNTER — Other Ambulatory Visit (HOSPITAL_BASED_OUTPATIENT_CLINIC_OR_DEPARTMENT_OTHER): Payer: Self-pay

## 2024-01-19 ENCOUNTER — Other Ambulatory Visit (HOSPITAL_COMMUNITY): Payer: Self-pay

## 2024-01-19 MED ORDER — LISDEXAMFETAMINE DIMESYLATE 50 MG PO CAPS
50.0000 mg | ORAL_CAPSULE | Freq: Every morning | ORAL | 0 refills | Status: DC
  Filled 2024-01-19 – 2024-02-10 (×4): qty 90, 90d supply, fill #0

## 2024-01-19 MED ORDER — VENLAFAXINE HCL ER 150 MG PO CP24
150.0000 mg | ORAL_CAPSULE | Freq: Every morning | ORAL | 1 refills | Status: AC
  Filled 2024-01-19 – 2024-02-10 (×2): qty 90, 90d supply, fill #0

## 2024-01-20 ENCOUNTER — Other Ambulatory Visit (HOSPITAL_BASED_OUTPATIENT_CLINIC_OR_DEPARTMENT_OTHER): Payer: Self-pay

## 2024-01-21 ENCOUNTER — Other Ambulatory Visit (HOSPITAL_BASED_OUTPATIENT_CLINIC_OR_DEPARTMENT_OTHER): Payer: Self-pay

## 2024-01-21 ENCOUNTER — Other Ambulatory Visit: Payer: Self-pay

## 2024-01-29 ENCOUNTER — Other Ambulatory Visit (HOSPITAL_BASED_OUTPATIENT_CLINIC_OR_DEPARTMENT_OTHER): Payer: Self-pay

## 2024-02-10 ENCOUNTER — Other Ambulatory Visit: Payer: Self-pay

## 2024-02-10 ENCOUNTER — Other Ambulatory Visit (HOSPITAL_BASED_OUTPATIENT_CLINIC_OR_DEPARTMENT_OTHER): Payer: Self-pay

## 2024-03-06 ENCOUNTER — Other Ambulatory Visit (HOSPITAL_BASED_OUTPATIENT_CLINIC_OR_DEPARTMENT_OTHER): Payer: Self-pay

## 2024-04-12 ENCOUNTER — Other Ambulatory Visit (HOSPITAL_BASED_OUTPATIENT_CLINIC_OR_DEPARTMENT_OTHER): Payer: Self-pay

## 2024-04-18 ENCOUNTER — Other Ambulatory Visit (HOSPITAL_BASED_OUTPATIENT_CLINIC_OR_DEPARTMENT_OTHER): Payer: Self-pay

## 2024-04-18 ENCOUNTER — Other Ambulatory Visit: Payer: Self-pay

## 2024-04-18 MED ORDER — ZEPBOUND 7.5 MG/0.5ML ~~LOC~~ SOAJ
SUBCUTANEOUS | 2 refills | Status: AC
  Filled 2024-04-18 (×2): qty 2, 28d supply, fill #0
  Filled 2024-05-09: qty 2, 28d supply, fill #1

## 2024-04-19 ENCOUNTER — Other Ambulatory Visit: Payer: Self-pay

## 2024-04-20 ENCOUNTER — Other Ambulatory Visit: Payer: Self-pay

## 2024-04-20 ENCOUNTER — Other Ambulatory Visit (HOSPITAL_BASED_OUTPATIENT_CLINIC_OR_DEPARTMENT_OTHER): Payer: Self-pay

## 2024-04-20 MED ORDER — LISDEXAMFETAMINE DIMESYLATE 50 MG PO CAPS
50.0000 mg | ORAL_CAPSULE | Freq: Every morning | ORAL | 0 refills | Status: DC
  Filled 2024-04-20 – 2024-05-09 (×2): qty 90, 90d supply, fill #0

## 2024-04-20 MED ORDER — VENLAFAXINE HCL ER 150 MG PO CP24
150.0000 mg | ORAL_CAPSULE | Freq: Every morning | ORAL | 1 refills | Status: AC
  Filled 2024-04-20 – 2024-05-09 (×2): qty 90, 90d supply, fill #0

## 2024-05-01 ENCOUNTER — Other Ambulatory Visit (HOSPITAL_BASED_OUTPATIENT_CLINIC_OR_DEPARTMENT_OTHER): Payer: Self-pay

## 2024-05-09 ENCOUNTER — Other Ambulatory Visit (HOSPITAL_BASED_OUTPATIENT_CLINIC_OR_DEPARTMENT_OTHER): Payer: Self-pay

## 2024-05-10 ENCOUNTER — Other Ambulatory Visit (HOSPITAL_BASED_OUTPATIENT_CLINIC_OR_DEPARTMENT_OTHER): Payer: Self-pay

## 2024-05-10 MED ORDER — ZEPBOUND 7.5 MG/0.5ML ~~LOC~~ SOAJ
7.5000 mg | SUBCUTANEOUS | 2 refills | Status: AC
  Filled 2024-05-10 – 2024-06-14 (×2): qty 2, 28d supply, fill #0

## 2024-06-14 ENCOUNTER — Other Ambulatory Visit (HOSPITAL_BASED_OUTPATIENT_CLINIC_OR_DEPARTMENT_OTHER): Payer: Self-pay

## 2024-07-19 ENCOUNTER — Other Ambulatory Visit (HOSPITAL_BASED_OUTPATIENT_CLINIC_OR_DEPARTMENT_OTHER): Payer: Self-pay

## 2024-07-19 ENCOUNTER — Other Ambulatory Visit: Payer: Self-pay

## 2024-07-19 MED ORDER — VENLAFAXINE HCL ER 150 MG PO CP24
150.0000 mg | ORAL_CAPSULE | Freq: Every morning | ORAL | 1 refills | Status: AC
  Filled 2024-07-19: qty 90, 90d supply, fill #0

## 2024-07-19 MED ORDER — LISDEXAMFETAMINE DIMESYLATE 50 MG PO CAPS
50.0000 mg | ORAL_CAPSULE | Freq: Every morning | ORAL | 0 refills | Status: AC
  Filled 2024-08-07: qty 90, 90d supply, fill #0

## 2024-07-25 ENCOUNTER — Other Ambulatory Visit (HOSPITAL_BASED_OUTPATIENT_CLINIC_OR_DEPARTMENT_OTHER): Payer: Self-pay

## 2024-07-25 MED ORDER — SPIRONOLACTONE 100 MG PO TABS
200.0000 mg | ORAL_TABLET | Freq: Every day | ORAL | 1 refills | Status: AC
  Filled 2024-07-25: qty 180, 90d supply, fill #0

## 2024-07-25 MED ORDER — ZEPBOUND 7.5 MG/0.5ML ~~LOC~~ SOAJ
7.5000 mg | SUBCUTANEOUS | 2 refills | Status: AC
  Filled 2024-07-25: qty 2, 28d supply, fill #0
  Filled 2024-08-16: qty 2, 28d supply, fill #1
  Filled 2024-08-16: qty 2, 28d supply, fill #0
  Filled 2024-08-16: qty 2, 28d supply, fill #1

## 2024-07-26 ENCOUNTER — Other Ambulatory Visit (HOSPITAL_BASED_OUTPATIENT_CLINIC_OR_DEPARTMENT_OTHER): Payer: Self-pay

## 2024-07-27 ENCOUNTER — Other Ambulatory Visit (HOSPITAL_BASED_OUTPATIENT_CLINIC_OR_DEPARTMENT_OTHER): Payer: Self-pay

## 2024-07-27 ENCOUNTER — Emergency Department (HOSPITAL_BASED_OUTPATIENT_CLINIC_OR_DEPARTMENT_OTHER): Payer: PRIVATE HEALTH INSURANCE

## 2024-07-27 ENCOUNTER — Emergency Department (HOSPITAL_BASED_OUTPATIENT_CLINIC_OR_DEPARTMENT_OTHER)
Admission: EM | Admit: 2024-07-27 | Discharge: 2024-07-27 | Disposition: A | Discharge status: Home / Self Care | Attending: Emergency Medicine | Admitting: Emergency Medicine

## 2024-07-27 ENCOUNTER — Emergency Department (HOSPITAL_BASED_OUTPATIENT_CLINIC_OR_DEPARTMENT_OTHER): Admitting: Radiology

## 2024-07-27 ENCOUNTER — Encounter (HOSPITAL_BASED_OUTPATIENT_CLINIC_OR_DEPARTMENT_OTHER): Payer: Self-pay | Admitting: Emergency Medicine

## 2024-07-27 ENCOUNTER — Other Ambulatory Visit: Payer: Self-pay

## 2024-07-27 DIAGNOSIS — J029 Acute pharyngitis, unspecified: Secondary | ICD-10-CM | POA: Diagnosis present

## 2024-07-27 MED ORDER — PANTOPRAZOLE SODIUM 20 MG PO TBEC: 20.0000 mg | DELAYED_RELEASE_TABLET | Freq: Every day | ORAL | 0 refills | Status: AC

## 2024-07-27 MED ORDER — DEXAMETHASONE 4 MG PO TABS
10.0000 mg | ORAL_TABLET | Freq: Once | ORAL | Status: AC
  Administered 2024-07-27: 10 mg via ORAL
  Filled 2024-07-27: qty 3

## 2024-07-27 NOTE — ED Provider Notes (Signed)
 [facility] EMERGENCY DEPARTMENT AT [facility] Provider Note   CSN: [id number] Arrival date & time: 07/27/24  1904     Patient presents with: Sore Throat   Erika Orr is a 33 y.o. female.   Patient has been having sore throat since she choked up on an English muffin this morning.  She feels like something is a little bit irritated in her throat but she has been able to eat and drink since.  She denies any fever chills weakness numbness tingling.  She is not had any vomiting.  She has a cough up pretty hard couple times but otherwise she has been able to eat and drink since.  But still has some sensation of something tickle in her lower throat area.  The history is provided by the patient.       Prior to Admission medications  Medication Sig Start Date End Date Taking? Authorizing Provider  pantoprazole (PROTONIX) 20 MG tablet Take 1 tablet (20 mg total) by mouth daily. 07/27/24 08/10/24 Yes Cassundra Mckeever, DO  lisdexamfetamine (VYVANSE ) 50 MG capsule Take 50 mg by mouth daily.    [provider]  lisdexamfetamine (VYVANSE ) 50 MG capsule Take 1 capsule (50 mg total) by mouth every morning. 11/23/22     lisdexamfetamine (VYVANSE ) 50 MG capsule Take 1 capsule (50 mg total) by mouth every morning. 07/19/24     montelukast (SINGULAIR) 10 MG tablet Take 10 mg by mouth at bedtime.    [provider]  NON FORMULARY Med Name: **  MMB 5/2.5/50 mg 3 x weekly    [provider]  PROAIR HFA 108 (90 Base) MCG/ACT inhaler INL 2 PFS PO Q 6 H PRN 11/22/18   [provider]  spironolactone  (ALDACTONE ) 100 MG tablet Take 1 tablet (100 mg total) by mouth at bedtime as needed. 05/16/19   Cleotilde Ronal RAMAN, MD  spironolactone  (ALDACTONE ) 100 MG tablet Take 2 tablets (200 mg total) by mouth daily. 07/25/24     tirzepatide  (MOUNJARO ) 5 MG/0.5ML Pen Inject 5 mg into the skin once a week. 11/12/22     tirzepatide  (MOUNJARO ) 7.5 MG/0.5ML Pen Inject 7.5 mg into the skin once  a week. 12/28/22     tirzepatide  (ZEPBOUND ) 2.5 MG/0.5ML Pen Inject 2.5 mg into the skin once a week. 12/28/22     tirzepatide  (ZEPBOUND ) 5 MG/0.5ML Pen Inject 5 mg into the skin once a week. 12/28/22     tirzepatide  (ZEPBOUND ) 7.5 MG/0.5ML Pen Inject 7.5 mg into the skin once a week. 11/24/22     tirzepatide  (ZEPBOUND ) 7.5 MG/0.5ML Pen Inject 7.5 mg into the skin once a week. 04/18/24     tirzepatide  (ZEPBOUND ) 7.5 MG/0.5ML Pen Inject 7.5 mg into the skin once a week. 05/10/24     tirzepatide  (ZEPBOUND ) 7.5 MG/0.5ML Pen Inject 7.5 mg into the skin once a week. 07/25/24     TRULICITY 1.5 MG/0.5ML SOPN INJ 1.5 MG INTO THE SKIN Q WEEK 03/06/19   [provider]  venlafaxine  XR (EFFEXOR -XR) 150 MG 24 hr capsule Take 1 capsule (150 mg total) by mouth every morning. 08/05/23     venlafaxine  XR (EFFEXOR -XR) 150 MG 24 hr capsule Take 1 capsule (150 mg total) by mouth every morning. 01/19/24     venlafaxine  XR (EFFEXOR -XR) 150 MG 24 hr capsule Take 1 capsule (150 mg total) by mouth every morning. 04/20/24     venlafaxine  XR (EFFEXOR -XR) 150 MG 24 hr capsule Take 1 capsule (150 mg total) by mouth every  morning. 07/19/24     venlafaxine  XR (EFFEXOR -XR) 75 MG 24 hr capsule Take by mouth daily. 09/09/17   [provider]    Allergies: Patient has no known allergies.    Review of Systems  Updated Vital Signs BP 119/76 (BP Location: Right Arm)   Pulse 85   Temp 98.1 F (36.7 C) (Oral)   Resp 20   LMP 07/05/2024 (Approximate)   SpO2 97%   Physical Exam Vitals and nursing note reviewed.  Constitutional:      General: She is not in acute distress.    Appearance: She is well-developed.  HENT:     Head: Normocephalic and atraumatic.     Nose: No congestion.     Mouth/Throat:     Mouth: Mucous membranes are moist.     Pharynx: No pharyngeal swelling, oropharyngeal exudate or posterior oropharyngeal erythema.     Tonsils: No tonsillar exudate or tonsillar abscesses.  Eyes:      Conjunctiva/sclera: Conjunctivae normal.  Cardiovascular:     Rate and Rhythm: Normal rate and regular rhythm.     Heart sounds: No murmur heard. Pulmonary:     Effort: Pulmonary effort is normal. No respiratory distress.     Breath sounds: Normal breath sounds.  Abdominal:     Palpations: Abdomen is soft.     Tenderness: There is no abdominal tenderness.  Musculoskeletal:        General: No swelling.     Cervical back: Neck supple.  Skin:    General: Skin is warm and dry.     Capillary Refill: Capillary refill takes less than 2 seconds.  Neurological:     Mental Status: She is alert.  Psychiatric:        Mood and Affect: Mood normal.     (all labs ordered are listed, but only abnormal results are displayed) Labs Reviewed - No data to display  EKG: None  Radiology: DG Neck Soft Tissue Result Date: 07/27/2024 EXAM: 1 VIEW(S) XRAY OF THE SOFT TISSUE NECK 07/27/2024 08:03:00 PM COMPARISON: None available. CLINICAL HISTORY: The patient feels like there is a food bolus stuck in her throat. FINDINGS: SOFT TISSUES: No contour bulge in the prevertebral soft tissues is seen to suspect an impacted food bolus. There is no radiopaque foreign body. EPIGLOTTIS: No epiglottic thickening. BONES: Mild upper thoracic scoliosis. LUNG APICES: Lung apices are clear. IMPRESSION: 1. No visible  impacted food bolus or radiopaque foreign body. Electronically signed by: Francis Quam MD 07/27/2024 08:15 PM EST RP Workstation: [id number]     Procedures   Medications Ordered in the ED  dexamethasone (DECADRON) tablet 10 mg (10 mg Oral Given 07/27/24 1944)                                    Medical Decision Making Amount and/or Complexity of Data Reviewed Radiology: ordered.  Risk Prescription drug management.   Erika Orr is here with some throat irritation after choking up an English muffin earlier this morning.  She has a sensation of something feeling irritated or stuck in her throat.   But she looks very well.  She has no stridor no wheezing.  Oropharynx is clear.  It sounds like she made this English muffin.  This was not purchased from anywhere.  Seems unlikely that there is a metallic foreign body or any other process.  She choked a little bit when she ate this English  muffin and overall I suspect she has some irritation or maybe a small scratch from this.  She is not concerned about being pregnant.  Will give her a dose of Decadron.  Will get soft tissue neck x-ray to see if there is any obvious foreign body but I have suspicion that she likely just irritated the throat from this aggressive cough when she was trying to eat this English muffin.  Anticipate putting her on PPI and having her follow-up with ENT if not improving.  Possible that she could have had a mild aspiration we will have her continue to watch for any fever or pneumonia symptoms.  X-ray shows no acute findings.  She is feeling better.  Will start her on a PPI have her follow-up with ENT and primary care.  Told to return if symptoms worsen.  This chart was dictated using voice recognition software.  Despite best efforts to proofread,  errors can occur which can change the documentation meaning.      Final diagnoses:  Sore throat    ED Discharge Orders          Ordered    pantoprazole (PROTONIX) 20 MG tablet  Daily        07/27/24 2030               Ruthe Cornet, DO 07/27/24 2034

## 2024-07-27 NOTE — Discharge Instructions (Signed)
 Take Protonix as prescribed.  Follow-up with ENT if not resolved.  Return if symptoms worsen.  If you develop fever cough return for evaluation as we discussed.

## 2024-07-27 NOTE — ED Triage Notes (Signed)
 Ate breakfast sandwich this AM Feels like something is stuck in throat Airway clear no distressed breathing  \able to drink and eat without issue

## 2024-07-27 NOTE — ED Notes (Signed)
 Reviewed AVS/discharge instruction with patient. Time allotted for and all questions answered. Patient is agreeable for d/c and escorted to ed exit by staff.

## 2024-08-07 ENCOUNTER — Other Ambulatory Visit (HOSPITAL_BASED_OUTPATIENT_CLINIC_OR_DEPARTMENT_OTHER): Payer: Self-pay

## 2024-08-16 ENCOUNTER — Other Ambulatory Visit (HOSPITAL_COMMUNITY): Payer: Self-pay
